# Patient Record
Sex: Male | Born: 1955 | Race: Black or African American | Hispanic: No | Marital: Married | Smoking: Current every day smoker
Health system: Southern US, Community
[De-identification: ages and names within clinical notes are randomized; demographics above are authoritative.]

## PROBLEM LIST (undated history)

## (undated) DIAGNOSIS — I10 Essential (primary) hypertension: Secondary | ICD-10-CM

## (undated) DIAGNOSIS — K219 Gastro-esophageal reflux disease without esophagitis: Secondary | ICD-10-CM

## (undated) DIAGNOSIS — R0683 Snoring: Secondary | ICD-10-CM

## (undated) DIAGNOSIS — H539 Unspecified visual disturbance: Secondary | ICD-10-CM

## (undated) DIAGNOSIS — E785 Hyperlipidemia, unspecified: Secondary | ICD-10-CM

## (undated) DIAGNOSIS — M4800 Spinal stenosis, site unspecified: Secondary | ICD-10-CM

## (undated) DIAGNOSIS — Q233 Congenital mitral insufficiency: Secondary | ICD-10-CM

## (undated) DIAGNOSIS — M1990 Unspecified osteoarthritis, unspecified site: Secondary | ICD-10-CM

## (undated) DIAGNOSIS — R32 Unspecified urinary incontinence: Secondary | ICD-10-CM

## (undated) DIAGNOSIS — M545 Low back pain, unspecified: Secondary | ICD-10-CM

## (undated) DIAGNOSIS — H9319 Tinnitus, unspecified ear: Secondary | ICD-10-CM

## (undated) DIAGNOSIS — G5600 Carpal tunnel syndrome, unspecified upper limb: Secondary | ICD-10-CM

## (undated) HISTORY — DX: Gastro-esophageal reflux disease without esophagitis: K21.9

## (undated) HISTORY — DX: Snoring: R06.83

## (undated) HISTORY — DX: Hyperlipidemia, unspecified: E78.5

## (undated) HISTORY — DX: Spinal stenosis, site unspecified: M48.00

## (undated) HISTORY — PX: CARPAL TUNNEL RELEASE: SHX101

## (undated) HISTORY — DX: Essential (primary) hypertension: I10

## (undated) HISTORY — DX: Low back pain, unspecified: M54.50

## (undated) HISTORY — PX: SPINE SURGERY: SHX786

## (undated) HISTORY — PX: PARTIAL HIP ARTHROPLASTY: SHX733

## (undated) HISTORY — PX: BACK SURGERY: SHX140

---

## 1997-01-01 ENCOUNTER — Inpatient Hospital Stay
Admission: EM | Admit: 1997-01-01 | Disposition: A | Discharge status: Home / Self Care | Payer: Self-pay | Source: Ambulatory Visit | Admitting: Internal Medicine

## 1999-01-20 ENCOUNTER — Inpatient Hospital Stay
Admission: EM | Admit: 1999-01-20 | Disposition: A | Discharge status: Home / Self Care | Payer: Self-pay | Source: Emergency Department | Admitting: Internal Medicine

## 1999-02-03 ENCOUNTER — Ambulatory Visit
Admit: 1999-02-03 | Disposition: A | Discharge status: Home / Self Care | Payer: Self-pay | Source: Ambulatory Visit | Admitting: Internal Medicine

## 2001-02-17 ENCOUNTER — Ambulatory Visit: Admission: RE | Admit: 2001-02-17 | Payer: Self-pay | Source: Ambulatory Visit | Admitting: Orthopaedic Surgery

## 2002-03-04 ENCOUNTER — Ambulatory Visit
Admit: 2002-03-04 | Disposition: A | Discharge status: Home / Self Care | Payer: Self-pay | Source: Ambulatory Visit | Admitting: Internal Medicine

## 2002-04-03 ENCOUNTER — Ambulatory Visit: Admission: RE | Admit: 2002-04-03 | Payer: Self-pay | Source: Ambulatory Visit | Admitting: Gastroenterology

## 2002-10-23 ENCOUNTER — Ambulatory Visit
Admit: 2002-10-23 | Disposition: A | Discharge status: Home / Self Care | Payer: Self-pay | Source: Ambulatory Visit | Admitting: Internal Medicine

## 2002-11-26 ENCOUNTER — Ambulatory Visit
Admit: 2002-11-26 | Disposition: A | Discharge status: Home / Self Care | Payer: Self-pay | Source: Ambulatory Visit | Admitting: Orthopaedic Surgery

## 2004-01-16 DIAGNOSIS — M797 Fibromyalgia: Secondary | ICD-10-CM

## 2004-01-16 HISTORY — DX: Fibromyalgia: M79.7

## 2004-04-11 ENCOUNTER — Emergency Department: Admit: 2004-04-11 | Payer: Self-pay | Source: Emergency Department | Admitting: Emergency Medicine

## 2004-08-02 ENCOUNTER — Observation Stay
Admission: RE | Admit: 2004-08-02 | Disposition: A | Discharge status: Home / Self Care | Payer: Self-pay | Source: Ambulatory Visit | Admitting: Orthopaedic Surgery

## 2005-01-15 DIAGNOSIS — N200 Calculus of kidney: Secondary | ICD-10-CM

## 2005-01-15 HISTORY — DX: Calculus of kidney: N20.0

## 2005-01-17 ENCOUNTER — Ambulatory Visit
Admit: 2005-01-17 | Disposition: A | Discharge status: Home / Self Care | Payer: Self-pay | Source: Ambulatory Visit | Admitting: Orthopaedic Surgery

## 2006-06-18 ENCOUNTER — Ambulatory Visit: Admission: RE | Admit: 2006-06-18 | Payer: Self-pay | Source: Ambulatory Visit | Admitting: Orthopaedic Surgery

## 2007-01-01 ENCOUNTER — Inpatient Hospital Stay
Admission: EM | Admit: 2007-01-01 | Disposition: A | Discharge status: Home / Self Care | Payer: Self-pay | Source: Emergency Department | Admitting: Internal Medicine

## 2007-01-01 LAB — CBC AND DIFFERENTIAL
Basophils Absolute: 0 /mm3 (ref 0.0–0.2)
Basophils: 0 % (ref 0–2)
Eosinophils Absolute: 0.2 /mm3 (ref 0.0–0.7)
Eosinophils: 3 % (ref 0–5)
Granulocytes Absolute: 5.2 /mm3 (ref 1.8–8.1)
Hematocrit: 43.3 % (ref 42.0–52.0)
Hgb: 14 G/DL (ref 13.0–17.0)
Immature Granulocytes Absolute: 0
Immature Granulocytes: 0 %
Lymphocytes Absolute: 1.4 /mm3 (ref 0.5–4.4)
Lymphocytes: 19 % (ref 15–41)
MCH: 29.2 PG (ref 28.0–32.0)
MCHC: 32.3 G/DL (ref 32.0–36.0)
MCV: 90.2 FL (ref 80.0–100.0)
MPV: 10.3 FL (ref 7.4–10.4)
Monocytes Absolute: 0.5 /mm3 (ref 0.0–1.2)
Monocytes: 7 % (ref 0–11)
Neutrophils %: 71 % (ref 52–75)
Platelets: 280 /mm3 (ref 140–400)
RBC: 4.8 /mm3 (ref 4.70–6.00)
RDW: 13.5 % (ref 11.5–15.0)
WBC: 7.28 /mm3 (ref 3.50–10.80)

## 2007-01-01 LAB — BASIC METABOLIC PANEL
BUN: 14 mg/dL (ref 8–20)
CO2: 24 mEq/L (ref 21–30)
Calcium: 9.5 mg/dL (ref 8.6–10.2)
Chloride: 101 mEq/L (ref 98–107)
Creatinine: 1 mg/dL (ref 0.6–1.5)
Glucose: 88 mg/dL (ref 70–100)
Potassium: 4.4 mEq/L (ref 3.6–5.0)
Sodium: 140 mEq/L (ref 136–146)

## 2007-01-01 LAB — CK
Creatine Kinase (CK): 138 U/L (ref 20–297)
Creatine Kinase (CK): 119 U/L (ref 20–297)

## 2007-01-01 LAB — I-STAT TROPONIN CERNER: i-STAT Troponin: 0 ng/mL

## 2007-01-01 LAB — TROPONIN I QUANTITATIVE LEVEL CERNER: Troponin I: 0 ng/mL

## 2007-01-01 LAB — GFR

## 2007-01-02 LAB — CK: Creatine Kinase (CK): 116 U/L (ref 20–297)

## 2007-01-02 LAB — TROPONIN I QUANTITATIVE LEVEL CERNER: Troponin I: 0.01 ng/mL

## 2007-08-27 ENCOUNTER — Ambulatory Visit: Admission: RE | Admit: 2007-08-27 | Payer: Self-pay | Source: Ambulatory Visit | Admitting: Gastroenterology

## 2007-09-13 ENCOUNTER — Ambulatory Visit
Admit: 2007-09-13 | Disposition: A | Discharge status: Home / Self Care | Payer: Self-pay | Source: Ambulatory Visit | Admitting: Orthopaedic Surgery

## 2008-01-16 HISTORY — PX: JOINT REPLACEMENT: SHX530

## 2008-03-04 ENCOUNTER — Ambulatory Visit
Admit: 2008-03-04 | Disposition: A | Discharge status: Home / Self Care | Payer: Self-pay | Source: Ambulatory Visit | Admitting: Orthopaedic Surgery

## 2008-06-04 ENCOUNTER — Inpatient Hospital Stay
Admission: RE | Admit: 2008-06-04 | Disposition: A | Discharge status: Home Health | Payer: Self-pay | Source: Ambulatory Visit | Admitting: Orthopaedic Surgery

## 2008-06-04 LAB — TYPE AND SCREEN: AB Screen Gel: NEGATIVE

## 2008-06-05 LAB — BASIC METABOLIC PANEL
BUN: 11 mg/dL (ref 8–20)
CO2: 28 mEq/L (ref 21–30)
Calcium: 8.6 mg/dL (ref 8.6–10.2)
Chloride: 99 mEq/L (ref 98–107)
Creatinine: 0.9 mg/dL (ref 0.6–1.5)
Glucose: 109 mg/dL — ABNORMAL HIGH (ref 70–100)
Potassium: 4.6 mEq/L (ref 3.6–5.0)
Sodium: 135 mEq/L — ABNORMAL LOW (ref 136–146)

## 2008-06-05 LAB — CBC
Hematocrit: 30.4 % — ABNORMAL LOW (ref 42.0–52.0)
Hgb: 9.8 G/DL — ABNORMAL LOW (ref 13.0–17.0)
MCH: 28.4 PG (ref 28.0–32.0)
MCHC: 32.2 G/DL (ref 32.0–36.0)
MCV: 88.1 FL (ref 80.0–100.0)
MPV: 9.7 FL (ref 9.4–12.3)
Platelets: 302 /mm3 (ref 140–400)
RBC: 3.45 /mm3 — ABNORMAL LOW (ref 4.70–6.00)
RDW: 13.4 % (ref 11.5–15.0)
WBC: 8.11 /mm3 (ref 3.50–10.80)

## 2008-06-05 LAB — GFR

## 2008-06-05 LAB — PT AND APTT
PT INR: 1.1 {INR} (ref 0.9–1.1)
PT: 14.6 s (ref 12.6–15.0)

## 2008-06-05 LAB — APTT: PTT: 28 s (ref 23–37)

## 2008-06-06 LAB — CBC
Hematocrit: 28.2 % — ABNORMAL LOW (ref 42.0–52.0)
Hgb: 8.8 G/DL — ABNORMAL LOW (ref 13.0–17.0)
MCH: 28.1 PG (ref 28.0–32.0)
MCHC: 31.2 G/DL — ABNORMAL LOW (ref 32.0–36.0)
MCV: 90.1 FL (ref 80.0–100.0)
MPV: 9.3 FL — ABNORMAL LOW (ref 9.4–12.3)
Platelets: 278 /mm3 (ref 140–400)
RBC: 3.13 /mm3 — ABNORMAL LOW (ref 4.70–6.00)
RDW: 13.5 % (ref 11.5–15.0)
WBC: 10.17 /mm3 (ref 3.50–10.80)

## 2008-06-06 LAB — PT AND APTT
PT INR: 1.1 {INR} (ref 0.9–1.1)
PT: 15.1 s — ABNORMAL HIGH (ref 12.6–15.0)

## 2008-06-06 LAB — BASIC METABOLIC PANEL
BUN: 10 mg/dL (ref 8–20)
CO2: 26 mEq/L (ref 21–30)
Calcium: 8.5 mg/dL — ABNORMAL LOW (ref 8.6–10.2)
Chloride: 100 mEq/L (ref 98–107)
Creatinine: 0.9 mg/dL (ref 0.6–1.5)
Glucose: 112 mg/dL — ABNORMAL HIGH (ref 70–100)
Potassium: 4 mEq/L (ref 3.6–5.0)
Sodium: 135 mEq/L — ABNORMAL LOW (ref 136–146)

## 2008-06-06 LAB — GFR

## 2008-06-06 LAB — APTT: PTT: 28 s (ref 23–37)

## 2008-06-07 LAB — CBC
Hematocrit: 28.1 % — ABNORMAL LOW (ref 42.0–52.0)
Hgb: 8.8 G/DL — ABNORMAL LOW (ref 13.0–17.0)
MCH: 28.2 PG (ref 28.0–32.0)
MCHC: 31.3 G/DL — ABNORMAL LOW (ref 32.0–36.0)
MCV: 90.1 FL (ref 80.0–100.0)
MPV: 9.2 FL — ABNORMAL LOW (ref 9.4–12.3)
Platelets: 289 /mm3 (ref 140–400)
RBC: 3.12 /mm3 — ABNORMAL LOW (ref 4.70–6.00)
RDW: 13.3 % (ref 11.5–15.0)
WBC: 10.87 /mm3 — ABNORMAL HIGH (ref 3.50–10.80)

## 2008-06-07 LAB — BASIC METABOLIC PANEL
BUN: 10 mg/dL (ref 8–20)
CO2: 29 mEq/L (ref 21–30)
Calcium: 8.7 mg/dL (ref 8.6–10.2)
Chloride: 101 mEq/L (ref 98–107)
Creatinine: 0.9 mg/dL (ref 0.6–1.5)
Glucose: 108 mg/dL — ABNORMAL HIGH (ref 70–100)
Potassium: 4.6 mEq/L (ref 3.6–5.0)
Sodium: 138 mEq/L (ref 136–146)

## 2008-06-07 LAB — APTT: PTT: 37 s (ref 23–37)

## 2008-06-07 LAB — PT AND APTT
PT INR: 1.2 {INR} — ABNORMAL HIGH (ref 0.9–1.1)
PT: 15.5 s — ABNORMAL HIGH (ref 12.6–15.0)

## 2008-06-09 ENCOUNTER — Emergency Department: Admit: 2008-06-09 | Payer: Self-pay | Source: Emergency Department | Admitting: Emergency Medicine

## 2008-09-04 ENCOUNTER — Ambulatory Visit
Admit: 2008-09-04 | Disposition: A | Discharge status: Home / Self Care | Payer: Self-pay | Source: Ambulatory Visit | Admitting: Orthopaedic Surgery

## 2008-11-01 ENCOUNTER — Ambulatory Visit
Admit: 2008-11-01 | Disposition: A | Discharge status: Home / Self Care | Payer: Self-pay | Source: Ambulatory Visit | Admitting: Orthopaedic Surgery

## 2009-10-28 ENCOUNTER — Ambulatory Visit
Admit: 2009-10-28 | Disposition: A | Discharge status: Home / Self Care | Payer: Self-pay | Source: Ambulatory Visit | Admitting: Orthopaedic Surgery

## 2009-11-02 ENCOUNTER — Ambulatory Visit
Admission: RE | Admit: 2009-11-02 | Disposition: A | Discharge status: Home / Self Care | Payer: Self-pay | Source: Ambulatory Visit | Admitting: Orthopaedic Surgery

## 2010-09-16 HISTORY — PX: COLONOSCOPY: SHX174

## 2010-10-16 ENCOUNTER — Ambulatory Visit: Admission: RE | Admit: 2010-10-16 | Payer: Self-pay | Source: Ambulatory Visit | Admitting: Orthopaedic Surgery

## 2010-10-26 LAB — ECG 12-LEAD
Atrial Rate: 74 {beats}/min
P Axis: 15 degrees
P-R Interval: 164 ms
Q-T Interval: 382 ms
QRS Duration: 92 ms
QTC Calculation (Bezet): 424 ms
R Axis: -5 degrees
T Axis: 18 degrees
Ventricular Rate: 74 {beats}/min

## 2010-11-02 NOTE — Consults (Signed)
DATE OF BIRTH:                        1955/07/22      ADMISSION DATE:                     01/01/2007            PATIENT LOCATION:                    [id number]            DATE OF CONSULTATION:      CONSULTANT:                        Jairo Ben, MD      CONSULTING SERVICE:            REFERRING PHYSICIAN:  Renette Butters, M.D.            REASON FOR CONSULTATION:  Chest pain.            HISTORY OF PRESENT ILLNESS:  This patient is a 55 year old very pleasant      gentleman, who after an emotional stress developed chest pain.  This was      substernal and epigastric area, lasted for several hours.  He also was      nauseous during the day that this started even before his chest pain.  This      was not associated with shortness of breath, syncope or presyncope.  He has      had similar episodes in the past, evaluated with stress test in 2001.            PAST MEDICAL HISTORY:  Positive for hypertension and dyslipidemia.            PAST SURGICAL HISTORY:  Lumbar laminectomy.  He does have history of      anxiety.            FAMILY HISTORY:  Pacemaker in uncle.            SOCIAL HISTORY:  He is married, has three children.  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            REVIEW OF SYSTEMS:  Negative for GI and GU symptoms.  No diarrhea.  No      fever or chills, cough.  All other systems are negative.            MEDICATIONS:  Benicar, tramadol, Voltaren.            ALLERGIES:  Cipro.            PHYSICAL EXAMINATION:  On physical examination his blood pressure is      119/67, pulse is 81 beats per minute and regular.  Respirations are 20,      temperature is 98.9.  Head and neck examination is within normal limits.      Cardiovascular examination;  No JVD, no carotid bruit.  S1, S2 regular.  No      extra sound or murmur.  Lungs; Clear to auscultation and percussion.      Abdomen; Soft and nontender.  No organomegaly.  Bowel sounds are present.      There is no peripheral edema.   Distal pulses are 2+ and equal. He is alert,      oriented x3, in appropriate mood.  There  are no focal neurologic findings.            LABORATORY DATA:  Sodium 140, potassium 4.4, BUN 14, creatinine 1.      Troponin is negative x3.  White blood cells 7.28, hemoglobin 14, platelets      280,000.  EKG is negative.  Chest x-ray was negative.            IMPRESSION      1.   Chest pain, atypical;  This patient has multiple risk factors for      coronary artery disease including his age, hypertension, and hyperlipemia      as well as his gender.  Therefore, the chest pain that occurred at the time      that he has an emotional stress may represent cardiac ischemia.  Further      workup would be needed.  Currently his _____ risk score is low with      negative troponin, negative EKG and therefore it would be appropriate to      discharge the patient for outpatient workup.  We will schedule him for      nuclear stress test tomorrow at the office.  In the meantime suggest      continuation of current medical treatment.      2.   Hypertension;  On treatment with ARB, good blood pressure control.      3.   Dyslipidemia;  On statin.  He tells me that the last measurement      several months ago showed a good cholesterol level.            Thank you very much for involving me in the care of this patient.  Please      do not hesitate to contact me if there is any question.            Sincerely,                                                Electronic Signing MD: Jairo Ben, MD  ([id number])            D: 01/02/2007 by Jairo Ben, MD      T: 01/02/2007 by [id number] ([id number]) (N: [id number])      cc:  Renette Butters, MD          Jairo Ben, MD

## 2010-11-02 NOTE — Op Note (Unsigned)
DATE OF BIRTH:                        09/24/1955      ADMISSION DATE:                     06/18/2006            PATIENT LOCATION:                     [id number]            DATE OF PROCEDURE:                   06/18/2006      SURGEON:                            Mertie Moores, MD      ASSISTANT(S):                  PREOPERATIVE DIAGNOSIS:  CARPAL TUNNEL SYNDROME LEFT UPPER EXTREMITY.            POSTOPERATIVE DIAGNOSIS:  CARPAL TUNNEL SYNDROME LEFT UPPER EXTREMITY.            PROCEDURE:  LEFT CARPAL TUNNEL DECOMPRESSION.            ANESTHESIA:  Local block with sedation.            TOURNIQUET TIME:  Approximately 20 minutes.            INDICATIONS FOR PROCEDURE:  The patient is a 55 year old status post a      right carpal tunnel decompression.  He had great relief of his symptoms on      the right side.  Now he has symptoms on the left side.  He has had ongoing      symptoms despite conservative management.  He is now for left carpal tunnel      decompression.            DESCRIPTION OF PROCEDURE:  The patient was brought to the operating room      where IV sedation was administered without difficulty.  Following this the      left upper extremity was prepped and draped in the usual sterile fashion.      The extremity was exsanguinated using an Esmarch bandage and this was left      in place proximally as a tourniquet.            Under 4.0 loupe magnification, incision was made in line with the      hypothenar crease.  This was brought down to the subcutaneous tissue using      blunt dissection.  The palmar fascia was identified and it was incised in      line with the incision.  The transverse carpal ligament was identified      beneath this and a curved clamp was placed beneath this going from distally      to proximally.  The ligament was then divided sharply down to the clamp.      The proximal portion of the ligament was divided under direct vision using      straight tenotomy scissors.  There was good retraction of  the ends of the      ligament following its division.  Inspection at this time showed some      hyperemia at  the median nerve.  There was no frank hourglass narrowing      noted.  There were no masses palpable within the canal.  There was no      hourglass narrowing noted.  There was not significant flexor      tenosynovitis.            The wound was irrigated with normal saline solution.  The palmar fascia was      reapproximated using 3-0 Vicryl sutures.  The skin was closed using 5-0      nylon sutures in an interrupted simple fashion.  Xeroform gauze was applied      followed by 4 x 4 gauze pads.  A bulky dressing was applied and held in      place with Kling followed by a Webril.  An Ace bandage was applied over      this as a compression dressing.            The Esmarch bandage was removed at this time.  There was good return of      capillary refill to the fingertips      following removal of the Esmarch bandage.  The patient was awakened without      difficulty.  He was transferred to a recovery room stretcher.  He was taken      to the secondary recovery room in stable condition having tolerated the      procedure well.                                          ___________________________________          Date Signed: __________      Mertie Moores, MD  ([id number])            D: 06/18/2006 by Mertie Moores, MD      T: 06/18/2006 by [id number] ([id number]) (F:[id number])      cc:  Mertie Moores, MD

## 2010-11-03 NOTE — Op Note (Signed)
Jesus Bowman, Jesus Bowman      MRN:          [id number]      Account:      [id number]      Document ID:  [id number]      Procedure Date: 11/02/2009            Admit Date: 11/02/2009            Patient Location: [id number]      Patient Type: A            SURGEON: Clelia Schaumann MD      ASSISTANT:                  ASSISTANT:      Tomma Rakers            PREOPERATIVE DIAGNOSES:      1.  Lumbar spinal stenosis, with right-sided radiculopathy, L4-L5.      2.  Degenerative spondylolisthesis, L4-L5.            POSTOPERATIVE DIAGNOSES:      1.  Lumbar spinal stenosis, with right-sided radiculopathy, L4-L5.      2.  Degenerative spondylolisthesis, L4-L5.            TITLES OF PROCEDURES:      Laminotomy, foraminotomy L4-L5, right.            ANESTHESIA:      General.            COMPLICATIONS:      None.            DRAINS:      None.            INDICATIONS FOR PROCEDURE:      The patient is a 55 year old gentleman with severe right leg pain secondary      to the above diagnoses, who presents now for surgery, as nonoperative      measures have failed to alleviate his symptoms.  Preoperatively, he was      counseled regarding indications for procedure and potential complications.      Additionally, the patient has an early, very mild degenerative slip.      Because he is overwhelmingly in leg pain dominantly, we have decided to      proceed with decompressive surgery.  He obtained a second opinion from a      neurosurgeon, who recommended that as well.  He is aware, however, that he      may require, and indeed is likely to require, reconstructive surgery, i.e.,      a fusion in the future at this level.            DESCRIPTION OF PROCEDURE:                                   Page 1 of 2      Jesus Bowman, Jesus Bowman      MRN:          [id number]      Account:      [id number]      Document ID:  [id number]      Procedure Date: 11/02/2009            The patient received prophylactic antibiotics in the holding area.  He was      then brought  to the operating room.  After the induction of anesthesia,  a      Foley catheter was placed, and sequential compression devices were applied      to both legs.  He was then carefully placed in the prone position on the      Wilson frame.  Care was taken to ensure his abdomen was free of compression      and all bony prominences were well padded.  The back was then prepped and      draped in usual fashion.  The patient's previous incision was utilized,      dissection carried sharply through the skin and subcutaneous tissue.      Hemostasis was obtained with use of Bovie.  The paraspinal musculature was      carefully and meticulously mobilized out to the lateral border of the facet      joint on the right.  Excellent hemostasis was obtained.  The L4-L5      interspace was well exposed.  A Taylor retractor was placed at the facet      joint.  A marker was place confirming we were indeed at L4-L5.  Once this      was done, the inferior half of the L5 lamina was thinned with a Midas Rex.      Laminotomy was completed with a Kerrison rongeur.  The ligamentum flavum      was resected, and laminotomy was also performed at L5.  The bony      decompression continued out to the medial border of the pedicle of L5, and      lateral to this at the level of the disk space, the L4 foramen was noted to      be closed by hypertrophied facet, and a foraminotomy was performed.  At the      completion of this decompression, a Woodson elevator could pass freely out      the L4 foramen, freely under and over the L5 nerve root and along the      pedicle, with no evidence of impingement.  The Kerrison was also turned,      and some central decompression was performed as well.  The wound was      irrigated.  The exposed dura was covered with Gelfoam soaked in thrombin      and Avitene slurry.  The wound was then closed at the level of the fascia      with #1 Vicryl in a figure-of-eight stitch.  Subcutaneous tissue was      repaired with  2-0 Vicryl.  The skin was closed in a subcuticular fashion.      A sterile dressing was applied.  The patient tolerated the procedure well.      There were no complications.  He was transferred to his bed and then to the      recovery room without incident.                                    D:  11/02/2009 10:45 AM by Dr. Edman Circle. Marjo Bicker, MD ([id number])      T:  11/02/2009 16:39 PM by [id number]                  VW:[id number] Al-Dalli MD  Page 2 of 2      Authenticated by Clelia Schaumann ([id number]) On 11/08/2009 02:47:26 PM

## 2010-11-03 NOTE — Discharge Summary (Signed)
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  He was admitted postoperatively      after a total hip replacement without incident.  He was managed with      appropriate antibiotic coverage and anticoagulation throughout his      hospitalization.  Ultimately, discharged on aspirin as needed for pain,      Percocet for pain, with home OT, PT arranged in stable condition, followed      as per protocol.  No complications were encountered during the procedure.      Details are found in the summary.            HOSPITAL COURSE:      He was admitted with a history of avascular necrosis of the left hip with      severe degenerative changes, history of essential hypertension, history of      anxiety, history of urinary calculi, and history of smoking.            He was admitted postoperatively for a total hip arthroplasty as outlined      after cleared medically by his primary care.  These records are on the      chart.            The patient was on Percocet, Ambien, Baclofen, fish oil, and Crestor.            Patient has an allergy to CIPRO and MORPHINE.            The patient was managed with vancomycin throughout the hospitalization      without difficulty, mobilized as per protocol, discharged on the      medications noted before on Jun 07, 2008, without incident.  The patient      will be followed in the usual post-discharge fashion.  Home OT, PT      arranged.  He was managed with an aspirin for anticoagulation and      mobilization, understands his care and his risks.  No complications were      encountered.  The patient will follow in the usual fashion.            The remainder of historical, physical, and laboratory findings can be  found      in the chart.                        Electronic Signing Provider      _______________________________     Date/Time Signed: _____________      Blanchard Kelch III MD [phone])            D:  06/30/2008 09:17 AM by Dr. Blanchard Kelch III, MD [phone])      T:  06/30/2008 11:55 AM by [id number]          Everlean Cherry: [id number]) (Doc ID: [id number])  [id number] Zenda Alpers MD

## 2010-11-03 NOTE — Op Note (Signed)
Account Number: [id number]      Document ID: [id number]      Admit Date: 06/04/2008      Procedure Date: 06/04/2008            Patient Location: [id number]      Patient Type: I            SURGEON: Blanchard Kelch III MD      ASSISTANT:  Jens Som PA                  PREOPERATIVE DIAGNOSES:      Avascular necrosis, degenerative arthritis, protrusio, left hip.            POSTOPERATIVE DIAGNOSES:      Avascular necrosis, degenerative arthritis, protrusio, left hip.            TITLE OF PROCEDURE:      Left total hip arthroplasty using Biomet porous-coated hip.            NOTE:      Prior to procedure, risks, benefits, alternatives had been discussed.  The      patient elected to proceed.  The patient understood the possibility of      infection, dislocation, clots, anesthetic issues.  All of this was      discussed in its entirety in the office and the discussion included the      above, but was not limited to the above.  The patient elected to proceed.      He had been treated conservatively for quite some time.  The patient failed      conservative treatment and elected to proceed with total hip arthroplasty.            Mr. Casimiro Needle Early assisted through all aspects of the procedure.  His help      was absolutely mandatory and greatly appreciated through all phases of the      procedure outlined below.  He is a very large gentleman.  Left hip was done      through minimally invasive approach.  Without his help, it would have been      physically impossible.            DESCRIPTION OF PROCEDURE:      The patient was placed in the right lateral decubitus position.  He was      prepped and draped using ChloraPrep.  The left hip was approached through a      minimally invasive approach through about a 3-inch incision.  He is a very      heavy individual and the incision was minimally extended, but was able to      be utilized.  Soft tissues were divided.  Fascia was divided and the      gluteus medius was noted to have  tremendous fat infiltrate.  This was      obviously muscle that had not been utilized extensively.  This was elevated      anteriorly using stay sutures of #1 Ethibond suture.  Soft tissues were      elevated.  Anterior capsulectomy was performed.  Attempts were made to      dislocate the hip.  The hip was adherent to the cup.  Once the patient was      exposed, the hip was osteotomized and subsequently the soft tissues      elevated.  Tremendous soft tissues were identified in the acetabulum.  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  Once the head was removed, wounds were      irrigated, bleeding controlled.  The acetabulum was reamed and broached      after removal of multiple loose bodies anteriorly and in the cup itself.      The patient had cystic changes in the cup identified and some evidence of      protrusio, but once this was debrided nicely, it accommodated a 52-mm      porous-coated cup.  A screw was placed using a 25-mm screw using a superior      posterior pilot hole.  Once this was completed and secured, a high-wall      liner was placed that would accommodate a 32-mm head.            Reaming and broaching accommodated a 12-mm porous-coated stem with a +6      neck.  This was nicely secured.  The high wall was in the 11 o'clock      position.  The cup was placed in as secure a position as possible to      accommodate the acetabular dysplasia.            Wounds were irrigated.  Reduction was noted to be stable.  Bleeding      controlled and wounds closed in layers using #1 vertical mattress Ethibond      sutures to reapproximate the gluteus medius and tighten this up somewhat to      help hold the hip in an internal position.  The fascia closed with      interrupted and running #1 Ethibond suture, deep fat #1 Vicryl interrupted      and running suture, and 3-0 nylon for skin.  Abduction pillow applied.  The      patient returned to recovery room in stable condition without obvious      operative  complication to be admitted as per protocol.                        Electronic Signing Provider      _______________________________     Date/Time Signed: _____________      Blanchard Kelch III MD [phone])            D:  06/04/2008 15:28 PM by Dr. Blanchard Kelch III, MD [phone])      T:  06/05/2008 11:21 AM by [id number]          Everlean Cherry: [id number]) (Doc ID: [id number])                  [id number] Zenda Alpers MD

## 2010-11-29 ENCOUNTER — Ambulatory Visit: Payer: Medicare Other | Attending: Orthopaedic Surgery

## 2010-11-29 DIAGNOSIS — M48062 Spinal stenosis, lumbar region with neurogenic claudication: Secondary | ICD-10-CM | POA: Insufficient documentation

## 2010-11-29 LAB — CBC AND DIFFERENTIAL
Basophils Absolute Automated: 0.02 10*3/uL (ref 0.00–0.20)
Basophils Automated: 0 % (ref 0–2)
Eosinophils Absolute Automated: 0.06 10*3/uL (ref 0.00–0.70)
Eosinophils Automated: 1 % (ref 0–5)
Hematocrit: 43.8 % (ref 42.0–52.0)
Hgb: 14.1 g/dL (ref 13.0–17.0)
Immature Granulocytes Absolute: 0.01 10*3/uL
Immature Granulocytes: 0 % (ref 0–1)
Lymphocytes Absolute Automated: 2.03 10*3/uL (ref 0.50–4.40)
Lymphocytes Automated: 44 % — ABNORMAL HIGH (ref 15–41)
MCH: 29.4 pg (ref 28.0–32.0)
MCHC: 32.2 g/dL (ref 32.0–36.0)
MCV: 91.4 fL (ref 80.0–100.0)
MPV: 10.6 fL (ref 9.4–12.3)
Monocytes Absolute Automated: 0.45 10*3/uL (ref 0.00–1.20)
Monocytes: 10 % (ref 0–11)
Neutrophils Absolute: 2.07 10*3/uL (ref 1.80–8.10)
Neutrophils: 45 % — ABNORMAL LOW (ref 52–75)
Nucleated RBC: 0 /100 WBC
Platelets: 248 10*3/uL (ref 140–400)
RBC: 4.79 10*6/uL (ref 4.70–6.00)
RDW: 13 % (ref 12–15)
WBC: 4.63 10*3/uL (ref 3.50–10.80)

## 2010-11-29 LAB — URINALYSIS
Bilirubin, UA: NEGATIVE
Blood, UA: NEGATIVE
Glucose, UA: NEGATIVE
Leukocyte Esterase, UA: NEGATIVE
Nitrite, UA: NEGATIVE
Protein, UR: NEGATIVE
Specific Gravity UA POCT: 1.028 (ref 1.001–1.035)
Urine pH: 6.5 (ref 5.0–8.0)
Urobilinogen, UA: 1 EU/dL

## 2010-11-29 LAB — ECG 12-LEAD
Atrial Rate: 61 {beats}/min
P Axis: 12 degrees
P-R Interval: 148 ms
Q-T Interval: 386 ms
QRS Duration: 84 ms
QTC Calculation (Bezet): 388 ms
R Axis: -6 degrees
T Axis: 20 degrees
Ventricular Rate: 61 {beats}/min

## 2010-11-29 LAB — BASIC METABOLIC PANEL
BUN: 16 mg/dL (ref 8.0–20.0)
CO2: 28 mEq/L (ref 21–30)
Calcium: 10.4 mg/dL (ref 8.5–10.5)
Chloride: 103 mEq/L (ref 96–109)
Creatinine: 1.2 mg/dL (ref 0.5–1.5)
Glucose: 79 mg/dL (ref 70–100)
Potassium: 5.5 mEq/L — ABNORMAL HIGH (ref 3.5–5.3)
Sodium: 140 mEq/L (ref 135–146)

## 2010-11-29 LAB — PT AND APTT
PT INR: 1 (ref 0.9–1.1)
PT: 13 s (ref 12.6–15.0)
PTT: 27 s (ref 23–37)

## 2010-11-29 LAB — HEMOLYSIS INDEX: Hemolysis Index: 2 Index (ref 0–9)

## 2010-11-29 LAB — GFR: EGFR: >60

## 2010-12-01 ENCOUNTER — Ambulatory Visit: Payer: Medicare Other

## 2010-12-01 ENCOUNTER — Ambulatory Visit: Payer: Medicare Other | Attending: Orthopaedic Surgery

## 2010-12-01 DIAGNOSIS — M48062 Spinal stenosis, lumbar region with neurogenic claudication: Secondary | ICD-10-CM | POA: Insufficient documentation

## 2010-12-01 LAB — TYPE AND SCREEN
AB Screen Gel: NEGATIVE
ABO Rh: B POS

## 2010-12-01 NOTE — Pre-Procedure Instructions (Signed)
Labs/EKG/ MRSA swab/UA in system. CXR done @ [facility] 11/29/10.

## 2010-12-12 ENCOUNTER — Encounter
Admission: RE | Disposition: A | Discharge status: Home Health | Payer: Self-pay | Source: Ambulatory Visit | Attending: Orthopaedic Surgery

## 2010-12-12 ENCOUNTER — Inpatient Hospital Stay: Payer: Medicare Other | Admitting: Anesthesiology

## 2010-12-12 ENCOUNTER — Inpatient Hospital Stay: Payer: Medicare Other

## 2010-12-12 ENCOUNTER — Encounter: Payer: Self-pay | Admitting: Anesthesiology

## 2010-12-12 ENCOUNTER — Inpatient Hospital Stay: Payer: Medicare Other | Admitting: Orthopaedic Surgery

## 2010-12-12 ENCOUNTER — Inpatient Hospital Stay
Admission: RE | Admit: 2010-12-12 | Discharge: 2010-12-14 | DRG: 460 | Disposition: A | Discharge status: Home Health | Payer: Medicare Other | Source: Ambulatory Visit | Attending: Orthopaedic Surgery | Admitting: Orthopaedic Surgery

## 2010-12-12 DIAGNOSIS — G5790 Unspecified mononeuropathy of unspecified lower limb: Secondary | ICD-10-CM | POA: Diagnosis present

## 2010-12-12 DIAGNOSIS — K219 Gastro-esophageal reflux disease without esophagitis: Secondary | ICD-10-CM | POA: Diagnosis present

## 2010-12-12 DIAGNOSIS — E785 Hyperlipidemia, unspecified: Secondary | ICD-10-CM | POA: Diagnosis present

## 2010-12-12 DIAGNOSIS — IMO0002 Reserved for concepts with insufficient information to code with codable children: Secondary | ICD-10-CM | POA: Diagnosis present

## 2010-12-12 DIAGNOSIS — I10 Essential (primary) hypertension: Secondary | ICD-10-CM | POA: Diagnosis present

## 2010-12-12 DIAGNOSIS — M5137 Other intervertebral disc degeneration, lumbosacral region: Principal | ICD-10-CM | POA: Diagnosis present

## 2010-12-12 DIAGNOSIS — M48061 Spinal stenosis, lumbar region without neurogenic claudication: Secondary | ICD-10-CM | POA: Diagnosis present

## 2010-12-12 DIAGNOSIS — M51379 Other intervertebral disc degeneration, lumbosacral region without mention of lumbar back pain or lower extremity pain: Principal | ICD-10-CM | POA: Diagnosis present

## 2010-12-12 DIAGNOSIS — Z96649 Presence of unspecified artificial hip joint: Secondary | ICD-10-CM

## 2010-12-12 SURGERY — FUSION, ANTERIOR LUMBAR, LATERAL POSITION, LEVEL 1 (ALIF)
Anesthesia: Anesthesia General | Wound class: Clean

## 2010-12-12 MED ORDER — ZOLPIDEM TARTRATE 5 MG PO TABS
5.0000 mg | ORAL_TABLET | Freq: Every evening | ORAL | Status: DC | PRN
Start: 2010-12-12 — End: 2010-12-12

## 2010-12-12 MED ORDER — LIDOCAINE HCL 2 % IJ SOLN
INTRAMUSCULAR | Status: DC | PRN
Start: 2010-12-12 — End: 2010-12-12
  Administered 2010-12-12: 50 mg

## 2010-12-12 MED ORDER — GLYCOPYRROLATE 0.2 MG/ML IJ SOLN
INTRAMUSCULAR | Status: DC | PRN
Start: 2010-12-12 — End: 2010-12-12
  Administered 2010-12-12: .4 mg via INTRAVENOUS

## 2010-12-12 MED ORDER — ROSUVASTATIN CALCIUM 10 MG PO TABS
10.0000 mg | ORAL_TABLET | Freq: Every evening | ORAL | Status: DC
Start: 2010-12-12 — End: 2010-12-14
  Administered 2010-12-12 – 2010-12-13 (×2): 10 mg via ORAL
  Filled 2010-12-12 (×3): qty 1

## 2010-12-12 MED ORDER — ONDANSETRON HCL 4 MG/2ML IJ SOLN
4.0000 mg | Freq: Once | INTRAMUSCULAR | Status: AC | PRN
Start: 2010-12-12 — End: 2010-12-12

## 2010-12-12 MED ORDER — NEOSTIGMINE METHYLSULFATE 1 MG/ML IJ SOLN
INTRAMUSCULAR | Status: DC | PRN
Start: 2010-12-12 — End: 2010-12-12
  Administered 2010-12-12: 2 mg via INTRAVENOUS

## 2010-12-12 MED ORDER — ACETAMINOPHEN 325 MG PO TABS
650.0000 mg | ORAL_TABLET | Freq: Four times a day (QID) | ORAL | Status: DC | PRN
Start: 2010-12-12 — End: 2010-12-14
  Administered 2010-12-13: 650 mg via ORAL
  Filled 2010-12-12: qty 2

## 2010-12-12 MED ORDER — HYDROMORPHONE PCA 0.2 MG/ML 100 ML (OUTSOURCED)
INTRAVENOUS | Status: AC
Start: 2010-12-12 — End: 2010-12-12
  Filled 2010-12-12: qty 100

## 2010-12-12 MED ORDER — SODIUM CHLORIDE 0.9 % IV MBP
1.0000 g | Freq: Three times a day (TID) | INTRAVENOUS | Status: AC
Start: 2010-12-12 — End: 2010-12-13
  Administered 2010-12-13 (×2): 1 g via INTRAVENOUS
  Filled 2010-12-12 (×3): qty 1000

## 2010-12-12 MED ORDER — FENTANYL CITRATE 0.05 MG/ML IJ SOLN
INTRAMUSCULAR | Status: AC
Start: 2010-12-12 — End: 2010-12-12
  Administered 2010-12-12 (×2): 25 ug via INTRAVENOUS
  Filled 2010-12-12: qty 2

## 2010-12-12 MED ORDER — KCL IN DEXTROSE-NACL 40-5-0.45 MEQ/L-%-% IV SOLN
INTRAVENOUS | Status: DC
Start: 2010-12-12 — End: 2010-12-14

## 2010-12-12 MED ORDER — VITAMIN D (ERGOCALCIFEROL) 1.25 MG (50000 UT) PO CAPS
50000.0000 [IU] | ORAL_CAPSULE | Freq: Every day | ORAL | Status: DC
Start: 2010-12-13 — End: 2010-12-14
  Administered 2010-12-13 – 2010-12-14 (×2): 50000 [IU] via ORAL
  Filled 2010-12-12 (×2): qty 1

## 2010-12-12 MED ORDER — HYDROMORPHONE PCA 0.2 MG/ML 100 ML (OUTSOURCED)
INTRAVENOUS | Status: DC
Start: 2010-12-12 — End: 2010-12-14

## 2010-12-12 MED ORDER — HYDROMORPHONE BOLUS FROM BAG
0.5000 mg | INTRAVENOUS | Status: DC | PRN
Start: 2010-12-12 — End: 2010-12-12
  Filled 2010-12-12: qty 2.5

## 2010-12-12 MED ORDER — BUPIVACAINE-EPINEPHRINE (PF) 0.5% -1:200000 IJ SOLN
INTRAMUSCULAR | Status: DC | PRN
Start: 2010-12-12 — End: 2010-12-12
  Administered 2010-12-12: 10 mL via INTRAMUSCULAR

## 2010-12-12 MED ORDER — BACITRACIN 50000 UNITS IM SOLR
INTRAMUSCULAR | Status: DC | PRN
Start: 2010-12-12 — End: 2010-12-12
  Administered 2010-12-12: 50000 [IU]

## 2010-12-12 MED ORDER — THROMBIN 5000 UNITS EX SOLR
CUTANEOUS | Status: DC | PRN
Start: 2010-12-12 — End: 2010-12-12
  Administered 2010-12-12: 15000 [IU] via TOPICAL

## 2010-12-12 MED ORDER — FENTANYL CITRATE 0.05 MG/ML IJ SOLN
25.0000 ug | INTRAMUSCULAR | Status: DC | PRN
Start: 2010-12-12 — End: 2010-12-12

## 2010-12-12 MED ORDER — LACTATED RINGERS IV SOLN
INTRAVENOUS | Status: DC | PRN
Start: 2010-12-12 — End: 2010-12-12

## 2010-12-12 MED ORDER — CEFAZOLIN SODIUM 1 G IJ SOLR
INTRAMUSCULAR | Status: DC | PRN
Start: 2010-12-12 — End: 2010-12-12
  Administered 2010-12-12: 2 g via INTRAVENOUS

## 2010-12-12 MED ORDER — SODIUM CHLORIDE 0.9 % IR SOLN
Status: DC | PRN
Start: 2010-12-12 — End: 2010-12-12
  Administered 2010-12-12: 1000 mL

## 2010-12-12 MED ORDER — FENTANYL CITRATE 0.05 MG/ML IJ SOLN
INTRAMUSCULAR | Status: DC | PRN
Start: 2010-12-12 — End: 2010-12-12
  Administered 2010-12-12 (×3): 50 ug via INTRAVENOUS
  Administered 2010-12-12: 100 ug via INTRAVENOUS

## 2010-12-12 MED ORDER — HYDROMORPHONE HCL PF 1 MG/ML IJ SOLN
0.5000 mg | INTRAMUSCULAR | Status: DC | PRN
Start: 2010-12-12 — End: 2010-12-12

## 2010-12-12 MED ORDER — VALSARTAN 40 MG PO TABS
40.0000 mg | ORAL_TABLET | Freq: Every day | ORAL | Status: DC
Start: 2010-12-13 — End: 2010-12-14
  Administered 2010-12-13 – 2010-12-14 (×2): 40 mg via ORAL
  Filled 2010-12-12 (×2): qty 1

## 2010-12-12 MED ORDER — SODIUM CHLORIDE BACTERIOSTATIC 0.9 % IJ SOLN
INTRAMUSCULAR | Status: DC | PRN
Start: 2010-12-12 — End: 2010-12-12
  Administered 2010-12-12: 10 mL via INTRAMUSCULAR

## 2010-12-12 MED ORDER — NALOXONE HCL 0.4 MG/ML IJ SOLN
0.1000 mg | INTRAMUSCULAR | Status: DC | PRN
Start: 2010-12-12 — End: 2010-12-14

## 2010-12-12 MED ORDER — PROPOFOL 10 MG/ML IV EMUL
INTRAVENOUS | Status: DC | PRN
Start: 2010-12-12 — End: 2010-12-12
  Administered 2010-12-12: 200 mg via INTRAVENOUS

## 2010-12-12 MED ORDER — ONDANSETRON HCL 4 MG/2ML IJ SOLN
4.0000 mg | Freq: Once | INTRAMUSCULAR | Status: DC | PRN
Start: 2010-12-12 — End: 2010-12-12

## 2010-12-12 MED ORDER — ROCURONIUM BROMIDE 50 MG/5ML IV SOLN
INTRAVENOUS | Status: DC | PRN
Start: 2010-12-12 — End: 2010-12-12
  Administered 2010-12-12: 50 mg via INTRAVENOUS
  Administered 2010-12-12: 10 mg via INTRAVENOUS

## 2010-12-12 MED ORDER — GELATIN ABSORBABLE 100 EX MISC
CUTANEOUS | Status: DC | PRN
Start: 2010-12-12 — End: 2010-12-12
  Administered 2010-12-12: 1 via TOPICAL

## 2010-12-12 MED ORDER — LACTATED RINGERS IV SOLN
INTRAVENOUS | Status: DC
Start: 2010-12-12 — End: 2010-12-14

## 2010-12-12 MED ORDER — MICROFIBRILLAR COLL HEMOSTAT EX POWD
CUTANEOUS | Status: DC | PRN
Start: 2010-12-12 — End: 2010-12-12
  Administered 2010-12-12: 1 g via TOPICAL

## 2010-12-12 MED ORDER — ONDANSETRON HCL 4 MG/2ML IJ SOLN
INTRAMUSCULAR | Status: DC | PRN
Start: 2010-12-12 — End: 2010-12-12
  Administered 2010-12-12: 4 mg via INTRAVENOUS

## 2010-12-12 MED ORDER — BACLOFEN 10 MG PO TABS
10.0000 mg | ORAL_TABLET | Freq: Every evening | ORAL | Status: DC
Start: 2010-12-12 — End: 2010-12-14
  Administered 2010-12-12 – 2010-12-13 (×2): 10 mg via ORAL
  Filled 2010-12-12 (×3): qty 1

## 2010-12-12 MED ORDER — CYCLOBENZAPRINE HCL 10 MG PO TABS
10.0000 mg | ORAL_TABLET | Freq: Three times a day (TID) | ORAL | Status: DC | PRN
Start: 2010-12-12 — End: 2010-12-12

## 2010-12-12 MED ORDER — EPINEPHRINE HCL 1 MG/ML IJ SOLN
INTRAMUSCULAR | Status: DC | PRN
Start: 2010-12-12 — End: 2010-12-12
  Administered 2010-12-12: 1 mg

## 2010-12-12 MED ORDER — SODIUM CHLORIDE 0.9 % IJ SOLN
3.0000 mL | Freq: Three times a day (TID) | INTRAMUSCULAR | Status: DC
Start: 2010-12-12 — End: 2010-12-14
  Filled 2010-12-12: qty 10

## 2010-12-12 MED ORDER — HYDROMORPHONE HCL PF 1 MG/ML IJ SOLN
INTRAMUSCULAR | Status: DC
Start: 2010-12-12 — End: 2010-12-12
  Filled 2010-12-12: qty 1

## 2010-12-12 MED ORDER — METOCLOPRAMIDE HCL 5 MG/ML IJ SOLN
INTRAMUSCULAR | Status: DC | PRN
Start: 2010-12-12 — End: 2010-12-12
  Administered 2010-12-12: 10 mg via INTRAVENOUS

## 2010-12-12 MED ORDER — PHENYLEPHRINE 100 MCG/ML IV BOLUS (ANESTHESIA)
PREFILLED_SYRINGE | INTRAVENOUS | Status: DC | PRN
Start: 2010-12-12 — End: 2010-12-12
  Administered 2010-12-12: 100 ug via INTRAVENOUS

## 2010-12-12 SURGICAL SUPPLY — 113 items
BAG DECANTER STERILE (Procedure Accessories) ×2 IMPLANT
BANDAGE ADH PLSTR POLYACRYLATE CVRL 2YD (Bandage) ×1
BANDAGE ADHESIVE L2 YD X W6 IN STRETCH (Bandage) ×1 IMPLANT
BANDAGE ADHESIVE L2 YD X W6 IN STRETCH NONWOVEN POROUS COVER-ROLL (Bandage) ×1 IMPLANT
BNDG CVRL ADH 2YDX6IN PLSTR POLYACRYLATE (Bandage) ×1
CATH IV 14GX1.75IN NLTX (IV Supply) ×2 IMPLANT
CATHETER FOLEY KT 16FR (Catheter Micellaneous) ×1
CATHETER FOLEY KT 16FR (Catheter Miscellaneous) ×1 IMPLANT
CLEANER ELECTROSURGICAL TIP PENCIL (Procedure Accessories) ×2 IMPLANT
CLEANER ELECTROSURGICAL TIP PENCIL HANDSWITCH LECTROBRASIVE (Procedure Accessories) ×2 IMPLANT
CLEANER ESURG TIP PNCL LCTRBRS STRL (Procedure Accessories) ×4
CLOSURE STERI-STRIP 1X5IN (Dressing) ×4 IMPLANT
CONTAINER SPEC 8OZ NS SNPON LID TRNLU (Suction) ×2 IMPLANT
CORD BIPOLAR STRL DISP (Cautery) ×2 IMPLANT
COTTONOID 1/2X1" 80-1402" (Dressing) ×2 IMPLANT
COVER HNDL LIGHT SINGLES (Procedure Accessories) ×4 IMPLANT
COVER HVYDTY BLK 65X90IN (Drape) ×2 IMPLANT
COVER MAYO CNVRT STND 23IN PLS REINF (Drape) ×2
COVER STAND W23 IN REINFORCE PLASTIC (Drape) ×2 IMPLANT
COVER STND PLS MAYO CNVRT 23IN LF STRL (Drape) ×2 IMPLANT
CTTND 1/2X1/2" (Sponge) ×2 IMPLANT
DRAIN INCS SIL FULL FLUT FLT 3/8IN LF (Drain) ×2
DRAIN RADIOPAQUE 4 FREE FLOW CHANNEL (Drain) ×1 IMPLANT
DRAIN RADIOPAQUE 4 FREE FLOW CHANNEL BARD L3/8 IN INCISION SILICONE (Drain) ×1 IMPLANT
DRAPE 3/4 SHEET FANFLD 52X76IN (Drape) ×6 IMPLANT
DRAPE 74X41IN UNIVERSAL POLY XRAY C ARM CLOSURE STRAP (Drape) ×3 IMPLANT
DRAPE EQP VLCR POLY UNV STRDRP 74X41IN (Drape) ×6 IMPLANT
DRAPE INCISE IOBAN-2 60X35CM (Drape) ×2 IMPLANT
DRAPE MAG FM DVN 20X16IN LF STRL HNDFR (Drape) ×2
DRAPE MAGNETIC HAND FREE TRANSFER (Drape) ×1 IMPLANT
DRAPE SRG PE STRDRP 17X11IN LF STRL ADH (Drape) ×10 IMPLANT
DRAPE SRG TBRN CNVRT 122X106X77IN LF (Drape) ×2 IMPLANT
DRAPE SURGICAL ADHESIVE STRIP SMALL (Drape) ×5
DRAPE SURGICAL ADHESIVE STRIP SMALL TOWEL MATTE FINISH L17 IN X W11 IN (Drape) ×5 IMPLANT
DRAPE SURGICAL IMPERVIOUS REINFORCEMENT FENESTRATE ABSORBENT ARMBOARD (Drape) ×1 IMPLANT
DRESSING TRANSPARENT L4 3/4 IN X W4 IN (Dressing) ×2 IMPLANT
DRESSING TRANSPARENT L4 3/4 IN X W4 IN POLYURETHANE ADHESIVE (Dressing) ×2 IMPLANT
DRESSING TRNS PU STD TGDRM 4.75X4IN LF (Dressing) ×4
EVACUATOR RELIAVAC 100M (Drain) ×2 IMPLANT
EVACUATOR WOUND SILICONE 100CC (Drain) ×2 IMPLANT
GLOVE SRG BGL M 8 LTX STRL PF TXTR BEAD (Glove) ×4
GLOVE SRG NTR RBR 8 INDCTR BGL 299X103MM (Glove) ×4
GLOVE SURG BIOGEL LF SZ8 (Glove) ×4 IMPLANT
GLOVE SURG BIOGEL ORTHO SZ8 (Glove) ×4 IMPLANT
GLOVE SURGICAL 8 INDICATOR BIOGEL POWDER (Glove) ×2 IMPLANT
GLOVE SURGICAL 8 INDICATOR BIOGEL POWDER FREE SMOOTH BEAD CUFF (Glove) ×2 IMPLANT
GLOVE SURGICAL 8 POWDER FREE TEXTURE (Glove) ×2 IMPLANT
GLOVE SURGICAL 8 POWDER FREE TEXTURE BEAD CUFF NONPYROGENIC BIOGEL M (Glove) ×2 IMPLANT
GOWN SMART IMPERVIOUS LARGE (Gown) IMPLANT
GRAFT BN CANC DBM OSTEOSPONGE 50X20X7MM (Allograft) ×2 IMPLANT
GRAFT BN CORT CANC 20CC 1-8MM RDGRF (Allograft) ×2 IMPLANT
GRAFT BONE CANCELLOUS DEMINERALIZED BONE MATRIX L50 MM X W20 MM X H7 (Allograft) ×1 IMPLANT
GRAFT BONE CORTICAL CANCELLOUS 20 CC (Allograft) ×1 IMPLANT
GRAFT BONE CORTICAL CANCELLOUS 20 CC ALLOGRAFT CHIP PRESERVON (Allograft) ×1 IMPLANT
HRST DONUT HL-IN-ONE (Positioning Supplies) ×2 IMPLANT
INACTIVE USE LAWSON 93583 (Gown) ×2 IMPLANT
NEEDLE NOKOR FILTER 19GX1.5IN (Needles) ×2 IMPLANT
NEEDLE REG BEVEL 19GX1.5IN (Needles) ×4 IMPLANT
NEEDLE SPINAL BD OD22 GA L3 1/2 IN (Needles) ×1 IMPLANT
NEEDLE SPINAL L3 1/2 IN REGULAR WALL QUINCKE TIP OD22 GA BD (Needles) ×1 IMPLANT
NEEDLE SPNL PP RW BD QNCK 22GA 3.5IN LF (Needles) ×2
PACK AVID LUMBAR LAMINECTOMY (Pack) ×2 IMPLANT
PAD ABC GROUNDING BLUE (Laser Supplies) ×4 IMPLANT
PAD ABD PVC CRTY 9X5IN LF STRL 3 LYR (Dressing) ×2 IMPLANT
PAD ARMBOARD 20X8X2IN (Procedure Accessories) ×14 IMPLANT
PAD ELECTROSRG GRND REM W CRD (Procedure Accessories) ×4 IMPLANT
PATTIES 1X3 USE LAWSON 2442 (Sponge) ×2 IMPLANT
PENCIL ELECTRO PUSH BUTTON (Cautery) ×2 IMPLANT
POSITIONER HEAD SLOTTED ADLT (Positioning Supplies) ×2 IMPLANT
ROD SPINAL DENALI L40 MM OD5.5 MM (Rods) ×2 IMPLANT
ROD SPINAL DENALI L40 MM OD5.5 MM TITANIUM CONTOUR GRAY (Rods) ×2 IMPLANT
ROD SPNL TI CNTR DENALI 5.5MM 40MM NS (Rods) ×4 IMPLANT
SCREW 7.5X50MM (Screw) ×4 IMPLANT
SCREW BN TI MESA 6.5MM 50MM LF NS FNDTN (Screw) ×4 IMPLANT
SCREW L50 MM OD6.5 MM TITANIUM SPINE (Screw) ×2 IMPLANT
SCREW L50 MM OD6.5 MM TITANIUM SPINE FOUNDATION BONE MESA (Screw) ×2 IMPLANT
SLEEVE TED SCD LARGE (Procedure Accessories) ×2 IMPLANT
SOLUTION SRGPRP 74% ISPRP 0.7% IOD (Prep) ×4
SOLUTION SURGICAL PREP 26 ML DURAPREP (Prep) ×2 IMPLANT
SOLUTION SURGICAL PREP 26 ML DURAPREP 74% ISOPROPYL ALCOHOL 0.7% (Prep) ×2 IMPLANT
SPONGE GAUZE 12PLY STRL 4X4IN (Sponge) ×2 IMPLANT
SPONGE KITTNER ENDOSCOPIC (Sponge) ×2 IMPLANT
SPONGE LAP 18X18IN PREWASH WHT (Sponge) ×2
SPONGE LAPAROTOMY L18 IN X W18 IN (Sponge) ×1 IMPLANT
SPONGE LAPAROTOMY L18 IN X W18 IN PREWASH WHITE (Sponge) ×1 IMPLANT
SPONGE PEANUT C5 HOLDER DISSECTOR XRAY (Sponge) IMPLANT
SPONGE PEANUT C5 HOLDER DISSECTOR XRAY DETECTABLE OD3/8 IN DUKAL (Sponge) IMPLANT
SPONGE PEANUT RADPQE STRL3/8IN (Sponge)
SPONGE SRG VISTEC 8X4IN LF STRL 12 PLY (Sponge) ×2
SPONGE SURGICAL L8 IN X W4 IN 12 PLY (Sponge) ×1 IMPLANT
SPONGE SURGICAL L8 IN X W4 IN 12 PLY RADIOPAQUE BAND VISTEC BLUE WHITE (Sponge) ×1 IMPLANT
SUTURE ABS 1 CT1 VCL 18IN CR BRD 8 STRN (Suture) ×4
SUTURE ABS 2-0 CT1 VCL 18IN CR BRD 8 (Suture) ×4
SUTURE ABS 3-0 PS1 VCL MTPS 27IN BRD (Suture) ×4
SUTURE COATED VICRYL 1 CT-1 L18 IN (Suture) ×2 IMPLANT
SUTURE COATED VICRYL 2-0 CT-1 18IN CNTRL BRD 8 STRND UNDYED (Suture) ×2 IMPLANT
SUTURE COATED VICRYL 2-0 CT-1 L18 IN (Suture) ×2 IMPLANT
SUTURE COATED VICRYL 3-0 PS-1 L27 IN (Suture) ×2 IMPLANT
SYRINGE LUER LOCK 10CC (Syringes, Needles) ×8 IMPLANT
SYSTEM IRR MTL VTVU YNKR 12FR LF STRL (Suction) ×2 IMPLANT
SYSTEM IRRIGATION EXTEND SUCTION ILLUMINATION TUBE BULB TIP VITAL-VUE (Suction) ×1 IMPLANT
TAPE SURGICAL DURAPORE 3IN (Tape) ×8 IMPLANT
TOOL DISSECTING L14 CM MATCH HEAD FLUTE (Burr) ×1 IMPLANT
TOOL DISSECTING L14 CM MATCH HEAD FLUTE OD3 MM MIDAS REX LEGEND (Burr) ×1 IMPLANT
TOOL DSCT LGND 3MM 14MM (Burr) ×2
TOWEL L26 IN X W17 IN COTTON PREWASH DELINT BLUE ACTISORB DELUXE (Procedure Accessories) ×2 IMPLANT
TOWEL SRG CTTN 26X17IN LF STRL PREWASH (Procedure Accessories) ×4 IMPLANT
TOWEL STERILE REUSABLE 8PK (Procedure Accessories) ×4 IMPLANT
TRAY CATHETER FOLEY 16F (Tray) ×2 IMPLANT
TUBING CONNECTING STERILE 10FT (Tubing) ×1
TUBING SCT PVC ARG 3/16IN 10FT LF STRL (Tubing) ×1
TUBING SUCTION ID3/16 IN L10 FT (Tubing) ×1 IMPLANT
TUBING SUCTION ID3/16 IN L10 FT NONCONDUCTIVE STRAIGHT MALE FEMALE (Tubing) ×1 IMPLANT

## 2010-12-12 NOTE — OR Nursing (Signed)
Updated the family spoke to the wife.

## 2010-12-12 NOTE — Plan of Care (Signed)
Problem: Day of Surgery- Lumbar Discectomy/Laminectomy  Goal: Pain at adequate level as identified by patient  Outcome: Progressing  Assess pain every 2 hours . instruct pt on pca use

## 2010-12-12 NOTE — Progress Notes (Signed)
Pt arrived from pacu in bed to room 481, s/p laminectomy. Pt is sleepy but arousable. Pt has pca dilaudid 0.3 q 10 minute lock out, 6 doses max. ivf d51/2 ns with kcl at 100cc/hr. Pt denies any pain at present , spouse at bedside. Pt has foley to gravity and a JP to the left side from the back incision. Back dressing cdi, no drainage noted at site. Admission assessment completed, await dr childs to reconcile pca orders. md still at surgery.

## 2010-12-12 NOTE — Anesthesia Postprocedure Evaluation (Signed)
Anesthesia Post Evaluation    Patient: Jesus Bowman    Procedures performed: Procedure(s) with comments:  FUSION, ANTERIOR LUMBAR, LATERAL POSITION LEVEL 1 - L4-L5 ANTERIOR/POSTERIOR FUSION  LAMINECTOMY, POSTERIOR LUMBAR, DECOMP, INST, FUSION LEVEL 1 - L4-L5 ANTERIOR/POSTERIOR FUSION    Anesthesia type: General ETT    Patient location:Phase I PACU    Last vitals:   Blood pressure 124/77, pulse 77, temperature 98 F (36.7 C), temperature source Temporal Artery, resp. rate 16, weight 112.7 kg (248 lb 7.3 oz), SpO2 100.00%.    Post pain: Patient not complaining of pain, continue current therapy     Mental Status:sedated    Respiratory Function: tolerating face mask    Cardiovascular: stable    Nausea/Vomiting: patient not complaining of nausea or vomiting    Hydration Status: adequate    Regional Anesthesia: no apparent complications    Post assessment: no apparent anesthetic complications

## 2010-12-12 NOTE — Progress Notes (Signed)
POD 0 check.  Pt is AAx3, reports that pain is controlled with current PCA settings.  (2.4mg  dilaudid pca/4hrs).  95% on 2L NC, RR18.  Verified pump settings, encouraged patient to continue with pca use as needed.  Pt verbalizes an understanding.

## 2010-12-12 NOTE — Progress Notes (Signed)
Pt discharged to floor,  VSS, pain tolerable, Neuros intact, pt still has some numbness in RLE, same as pre-op ,   Phase 1 criteria met.  Report given to accepting RN.  Family notified.

## 2010-12-12 NOTE — Brief Op Note (Signed)
BRIEF OP NOTE    Date Time: 12/12/2010 12:54 PM    Patient Name:   Jesus Bowman    Date of Operation:   12/12/2010    Providers Performing:   Surgeon(s):  Clelia Schaumann, MD    Assistant (s): Vivien Rota    Operative Procedure:   Procedure(s):  FUSION, ANTERIOR LUMBAR, LATERAL POSITION LEVEL 1  LAMINECTOMY, POSTERIOR LUMBAR, DECOMP, INST, FUSION LEVEL 1    Preoperative Diagnosis:   Pre-Op Diagnosis Codes:     * Lumbar stenosis L4L5     * Degeneration of lumbar or lumbosacral intervertebral disc [722.52]    Postoperative Diagnosis:   same    Anesthesia:   General    Estimated Blood Loss:    75 cc    Implants:     Implant Name Type Inv. Item Serial No. Manufacturer Lot No. LRB No. Used Action   corticocanc     [id number] N/A 1 Implanted   bacterin     [id number] N/A 1 Implanted   SCREW MESA POLY 6.5X50MM - [id number] Screw SCREW MESA POLY 6.5X50MM  K2M  N/A 2 Implanted   SCREW 7.5X50MM - LOG339 Screw SCREW 7.5X50MM  K2M  N/A 2 Implanted   ROD SPINE CONTOURED 5.5 X 40 - [id number] Rods ROD SPINE CONTOURED 5.5 X 40   K2M   N/A 2 Implanted       Drains:   Drains: {DRAIN YES    Specimens:       Findings:   stenosis    Complications:   none      Signed by: Clelia Schaumann, MD                                                                              [facility]

## 2010-12-12 NOTE — Anesthesia Preprocedure Evaluation (Addendum)
Anesthesia Evaluation    AIRWAY    Mallampati: III    TM distance: >3 FB  Neck ROM: full  Mouth Opening:full   CARDIOVASCULAR    cardiovascular exam normal     DENTAL    (+) implants   PULMONARY    pulmonary exam normal and clear to auscultation     OTHER FINDINGS          Anesthesia Plan    ASA 3   general   Detailed anesthesia plan: general endotracheal        intravenous induction   informed consent obtained    Plan discussed with CRNA.        <IHSANLANDD>

## 2010-12-12 NOTE — H&P (Signed)
Jesus Bowman is an 55 y.o. male.    Past Medical History   Diagnosis Date   . Hypertension    . Hyperlipidemia    . Snoring    . Anemia      "borderline anemia"   . GERD (gastroesophageal reflux disease)    . Low back pain    . Rash      chronic groin rash    . Difficulty in walking      uses cane occasionally   . Vision abnormalities      wears glasses   . Spinal stenosis    . Neuropathy      bl le       Allergies:   Allergies   Allergen Reactions   . Ciprofloxacin Itching   . Morphine Nausea And Vomiting       Active Problems:   * No active hospital problems. *     Blood pressure 138/73, pulse 55, temperature 98 F (36.7 C), temperature source Temporal Artery, resp. rate 20, weight 112.7 kg (248 lb 7.3 oz), SpO2 98.00%.    Review of Systems   Constitutional: Negative.    HENT: Negative.    Eyes: Negative.    Respiratory: Negative.    Cardiovascular: Negative.    Gastrointestinal: Negative.    Genitourinary: Negative.    Musculoskeletal: Positive for back pain.   Neurological: Positive for sensory change and focal weakness.   Endo/Heme/Allergies: Negative.    Psychiatric/Behavioral: Negative.        Physical Exam   Constitutional: He is oriented to person, place, and time. He appears well-developed and well-nourished.   HENT:   Head: Normocephalic.   Eyes: Pupils are equal, round, and reactive to light.   Neck: Normal range of motion. Neck supple.   Cardiovascular: Normal rate and regular rhythm.    Pulmonary/Chest: Effort normal and breath sounds normal.   Abdominal: Soft. Bowel sounds are normal.   Musculoskeletal: Normal range of motion.   Neurological: He is alert and oriented to person, place, and time. He has normal reflexes.   Skin: Skin is warm and dry.   Psychiatric: He has a normal mood and affect.       Assessment:  Degenerative disc disease  Lumbar radiculopathy L4L5  Plan:  surgery    Clelia Schaumann  12/12/2010

## 2010-12-12 NOTE — Progress Notes (Signed)
Patient arrived to unit with PCA hanging and PCA orders not reconciled from PACU.  Called PACU, MD aware to reconcile.  Also called [facility] to make her aware.  Bedside nurse made aware.

## 2010-12-13 LAB — RED BLOOD CELLS OR HOLD

## 2010-12-13 LAB — HEMOGLOBIN AND HEMATOCRIT, BLOOD
Hematocrit: 37.7 % — ABNORMAL LOW (ref 42.0–52.0)
Hgb: 12.2 g/dL — ABNORMAL LOW (ref 13.0–17.0)

## 2010-12-13 MED ORDER — HYDROMORPHONE BOLUS FROM BAG
0.50 mg | Freq: Once | INTRAVENOUS | Status: AC
Start: 2010-12-13 — End: 2010-12-13
  Administered 2010-12-13: 0.5 mg via INTRAVENOUS
  Filled 2010-12-13: qty 2.5

## 2010-12-13 MED ORDER — ZOLPIDEM TARTRATE 5 MG PO TABS
10.00 mg | ORAL_TABLET | Freq: Every evening | ORAL | Status: DC | PRN
Start: 2010-12-13 — End: 2010-12-14
  Administered 2010-12-13: 10 mg via ORAL
  Filled 2010-12-13: qty 2

## 2010-12-13 NOTE — Progress Notes (Signed)
Foley d/ced at 0945 this am, urinal at bedside and fluids encouraged. Practicing IS can calf pumps. Back dressing clean dry and intact with jp drain patent to compression suction with return of sangenous drainage. Dangled at bedside and ambulated in room, up in bedside chair tolerating well.

## 2010-12-13 NOTE — Progress Notes (Signed)
Met with pt to issue IMM and to discuss [place] plans.  Expect home with family care.  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  Await therapy sessions and any further recommendations.

## 2010-12-13 NOTE — Progress Notes (Signed)
PROGRESS NOTE    Date Time: 12/13/2010 11:09 AM  Patient Name: Jesus Bowman  Attending Physician: Clelia Schaumann, MD    Assessment:   Stable    Plan:   PT/OT for ambulation and mobilization    Subjective:   C/O expected incisional pain.  Moving LEs well.  Dressing C/D/I   JP drain patent.      Medications:     Current Facility-Administered Medications   Medication Dose Route Frequency   . baclofen  10 mg Oral QHS   . cefazolin  1 g Intravenous Q8H   . HYDROmorphone  0.5 mg Intravenous Once   . rosuvastatin  10 mg Oral QHS   . sodium chloride (PF)  3 mL Intravenous Q8H   . valsartan  40 mg Oral Daily   . Vitamin D (Ergocalciferol)  50,000 Units Oral Daily       Physical Exam:     Filed Vitals:    12/13/10 0837   BP: 124/67   Pulse: 77   Temp: 99.2 F (37.3 C)   Resp: 20         Intake/Output Summary (Last 24 hours) at 12/13/10 1109  Last data filed at 12/13/10 0446   Gross per 24 hour   Intake   3510 ml   Output   2645 ml   Net    865 ml       Drains:     Closed/Suction Drain Posterior Bulb 10 Fr. (Active)   Site Description Clean;Dry;Intact 12/12/2010  3:00 PM   Dressing Status Clean;Gauze 12/12/2010  3:00 PM   Drainage Appearance Bloody 12/12/2010  3:00 PM   Status To bulb suction 12/12/2010  3:00 PM   Output (mL) 50 mL 12/13/2010  4:12 AM   Number of days:1       Urethral Catheter Double-lumen 16 Fr. (Active)   Line necessity reviewed? Yes 12/12/2010  1:35 PM   Site Assessment Clean 12/12/2010  1:35 PM   Collection Container Standard drainage bag 12/12/2010  1:35 PM   Securement Method Leg strap;Tape 12/12/2010  1:35 PM   Reason for Continuing Urinary Catheterization past POD 1 Comfort/Palliative care (pain with moving) 12/12/2010  1:35 PM   Output (mL) 400 mL 12/13/2010  4:12 AM   Number of days:1             Labs:     Lab Results   Component Value Date    WBC 4.63 11/29/2010    HGB 12.2* 12/13/2010    HCT 37.7* 12/13/2010    MCV 91.4 11/29/2010       Rads:   Radiological Procedure  reviewed.Yes          Signed by: Dewaine Oats, MD  Date/Time: 12/13/2010 11:09 AM

## 2010-12-13 NOTE — Anesthesia Postprocedure Evaluation (Signed)
Anesthesia Post Evaluation    Patient: Jesus Bowman    Procedures performed: Procedure(s) with comments:  FUSION, ANTERIOR LUMBAR, LATERAL POSITION LEVEL 1 - attempted L4-L5 ANTERIOR/POSTERIOR FUSION  LAMINECTOMY, POSTERIOR LUMBAR, DECOMP, INST, FUSION LEVEL 1 - L4-L5 ANTERIOR/POSTERIOR FUSION      See nursing record for vitals prior to [place] from pacu

## 2010-12-13 NOTE — Progress Notes (Signed)
Patient ambulated out in hall down to desk and back to bathroom, trying to void, unable, fluids encouraged.

## 2010-12-13 NOTE — Progress Notes (Addendum)
PAIN MANAGEMENT PROGRESS NOTE     Jesus Bowman is a 55 y.o. male admitted with Spinal stenosis, lumbar region, with neurogenic claudication [724.03] (SPIN [name] CLAUD)  LUMB/LUMBOSAC DISC DEGEN  161096 History of spinal [phone].    Allergies   Allergen Reactions   . Ciprofloxacin Itching   . Morphine Nausea And Vomiting       Uncomplicated recovery from anesthetic: yes    1 Day Post-Op  Procedure:Procedure(s):  FUSION, ANTERIOR LUMBAR, LATERAL POSITION LEVEL 1  LAMINECTOMY, POSTERIOR LUMBAR, DECOMP, INST, FUSION LEVEL 1 12/12/2010 - 12/13/2010      History   PMH:   Past Medical History   Diagnosis Date   . Hypertension    . Hyperlipidemia    . Snoring    . GERD (gastroesophageal reflux disease)    . Low back pain    . Rash      chronic groin rash    . Difficulty in walking      uses cane occasionally   . Vision abnormalities      wears glasses   . Spinal stenosis    . Neuropathy      bl le     PSH:   Past Surgical History   Procedure Date   . Carpal tunnel release      left 2008, right 2003   . Colonoscopy 09/2010   . Joint replacement 2010     left hip   . Back surgery 2006 and 2011     back surgery x 2   . Spine surgery      laminectomy 2006 & 2011     BMI: Body mass index is 38.84 kg/(m^2).    Home Pain Regimen:   Prescriptions prior to admission   Medication Sig   . Ascorbic Acid (VITAMIN C PO) Take 1 tablet by mouth daily.     Marland Kitchen b complex vitamins tablet Take 1 tablet by mouth every other day.     . baclofen (LIORESAL) 10 MG tablet Take 10 mg by mouth nightly.     . diclofenac (VOLTAREN) 50 MG EC tablet Take 50 mg by mouth daily. Take 2 tabs    . HYDROcodone-acetaminophen (VICODIN) 5-500 MG per tablet Take 1 tablet by mouth every 6 (six) hours as needed.     . IRON PO Take 1 tablet by mouth every other day.     Marland Kitchen MAGNESIUM PO Take 1 tablet by mouth every other day.     . Misc Natural Products (PROSTATE SUPPORT PO) Take 1 tablet by mouth daily.     . Multiple Vitamins-Minerals (ZINC PO) Take  1 tablet by mouth every other day.     . olmesartan (BENICAR) 40 MG tablet Take 40 mg by mouth daily.     . rosuvastatin (CRESTOR) 10 MG tablet Take 10 mg by mouth nightly.     Marland Kitchen VITAMIN D, ERGOCALCIFEROL, PO Take 1 tablet by mouth every other day.           Labs     Recent Labs   Basename 12/13/10 0530    WBC --    HGB 12.2*    HCT 37.7*       No results found for this basename: BUN,CREAT,AST,ALT,ALP,BILIT in the last 24 hours    No results found for this basename: INR in the last 24 hours    No results found for this basename: PT,PTT in the last 24 hours  Medication     Current Facility-Administered Medications   Medication Dose Route Frequency   . baclofen  10 mg Oral QHS   . cefazolin  1 g Intravenous Q8H   . HYDROmorphone  0.5 mg Intravenous Once   . rosuvastatin  10 mg Oral QHS   . sodium chloride (PF)  3 mL Intravenous Q8H   . valsartan  40 mg Oral Daily   . Vitamin D (Ergocalciferol)  50,000 Units Oral Daily       PRN Meds: acetaminophen, naloxone, ondansetron, zolpidem, DISCONTD: bacitracin, DISCONTD: bupivacaine-EPINEPHrine (PF), DISCONTD: cyclobenzaprine, DISCONTD: EPINEPHrine, DISCONTD: fentaNYL, DISCONTD: gelatin adsorbable, DISCONTD: HYDROmorphone, DISCONTD: HYDROmorphone, DISCONTD: microfibrillar collagen, DISCONTD: ondansetron, DISCONTD: sodium chloride, DISCONTD: Sodium Chloride Bacteriostatic, DISCONTD: thrombin, DISCONTD: zolpidem      CONTINUOUS INFUSIONS:       . dextrose 5 % and 0.45 % NaCl with KCl 40 mEq 100 mL/hr at 12/13/10 5784   . HYDROmorphone     . lactated ringers 100 mL/hr at 12/12/10 1324       Drug:Dilaudid   Continuous Rate:0  PCA Demand Dose: 0.3mg     Lockout Interval:   Max Doses Per Hour:6    Usage:6.44mg /12hr      On Q pump: no    Assessment:     Filed Vitals:    12/13/10 0837   BP: 124/67   Pulse: 77   Temp: 99.2 F (37.3 C)   Resp: 20       Alert and oriented: yes  Foley: no  Voiding: no ( has not yet voided since Foley d/cd today)  Chest tube:  no      Pain:  Pain Assessment  Pain Assessment: Numeric Scale (0-10) (12/13/10 1034)  Pain Score: 8-severe pain (12/13/10 1034)  POSS Score: Awake and Alert (12/13/10 1034)  Pain Location: Back (12/13/10 1034)  Pain Orientation: Lower (12/13/10 1034)  Pain Descriptors: Lambert Mody (12/13/10 1034)  Pain Frequency: Continuous (12/13/10 1034)  Effect of Pain on Daily Activities: moderate (12/13/10 1034)  Patient's Stated Comfort Functional Goal: 4-moderate pain (12/13/10 1034)  Pain Intervention(s): Medication (See eMAR) (12/13/10 1034)  Multiple Pain Sites: No (12/13/10 1034)                          Respiratory Assessment:    Oxygen Therapy  SpO2: 97 % (12/13/10 1034)  O2 Device: Nasal cannula (12/13/10 1034)  O2 Flow Rate (L/min): 2 L/min (12/13/10 1034)  Pulse Oximetry Type: Continuous (12/12/10 1324)    Nutrition/GI Status: small amount regular    General          Plan     Impression: Relates he wants  to get out of bed , has lower back pain, 8/10 .     POST-OPERATIVE PAIN    NEUROPATHIC PAIN    Patient or surrogate educated re: safe use of PCA and/or pain medication.     Patient or surrogate verbalizes or demonstrates understanding.      Additional Comments:  Instructed to increase PCA dosage especially prior to increased activity.     Plan:  Will give PCA Dilaudid 0.5mg  bolus x 1 now , prior to getting out of bed. Continue PCA at present settings.            Note was completed by Hillis Range 12/13/2010 11:00 AM     Anesthesiology Acute Pain Service Attending Note    Pain Service Progress Note and interval history reviewed:  agree  Patient seen and examined.    History:  mild    Physical exam:  awake    Plan:  Continue    Level of Service:  1    [name] Eben Burow

## 2010-12-14 LAB — HEMOGLOBIN AND HEMATOCRIT, BLOOD
Hematocrit: 36.6 % — ABNORMAL LOW (ref 42.0–52.0)
Hgb: 11.6 g/dL — ABNORMAL LOW (ref 13.0–17.0)

## 2010-12-14 MED ORDER — SENNOSIDES-DOCUSATE SODIUM 8.6-50 MG PO TABS
1.00 | ORAL_TABLET | Freq: Every evening | ORAL | Status: DC
Start: 2010-12-14 — End: 2010-12-14

## 2010-12-14 MED ORDER — OXYCODONE-ACETAMINOPHEN 5-325 MG PO TABS
2.00 | ORAL_TABLET | ORAL | Status: AC | PRN
Start: 2010-12-14 — End: 2010-12-24

## 2010-12-14 MED ORDER — OXYCODONE-ACETAMINOPHEN 5-325 MG PO TABS
1.00 | ORAL_TABLET | ORAL | Status: DC | PRN
Start: 2010-12-14 — End: 2010-12-14

## 2010-12-14 MED ORDER — OXYCODONE-ACETAMINOPHEN 5-325 MG PO TABS
2.00 | ORAL_TABLET | ORAL | Status: DC | PRN
Start: 2010-12-14 — End: 2010-12-14
  Administered 2010-12-14: 2 via ORAL
  Filled 2010-12-14: qty 2

## 2010-12-14 MED ORDER — HYDROMORPHONE HCL PF 1 MG/ML IJ SOLN
0.50 mg | INTRAMUSCULAR | Status: DC | PRN
Start: 2010-12-14 — End: 2010-12-14

## 2010-12-14 NOTE — Consults (Signed)
[facility]     Physical Therapy Evaluation     Patient: Jesus Bowman    MRN#: [id number]     Time of treatment: Time Calculation  PT Received On: 12/14/10  Start Time: 0930  Stop Time: 1000  Time Calculation (min): 30 min    Consult received for Jesus Bowman for PT Evaluation and Treatment.  Patient's medical condition is appropriate for Physical Therapy intervention at this time.    Precautions and Contraindications:   No bending, lifting, twisting        Medical Diagnosis: Spinal stenosis, lumbar region, with neurogenic claudication [724.03] (SPIN Waylan Rocher)  LUMB/LUMBOSAC DISC DEGEN  073710 History of spinal [phone]    History of Present Illness: Jesus Bowman is a 55 y.o. male admitted on 12/12/2010 s/p anterior/posterior lumbar fusion L4-5 12-12-10.    There is no problem list on file for this patient.       Past Medical/Surgical History:  Past Medical History   Diagnosis Date   . Hypertension    . Hyperlipidemia    . Snoring    . GERD (gastroesophageal reflux disease)    . Low back pain    . Rash      chronic groin rash    . Difficulty in walking      uses cane occasionally   . Vision abnormalities      wears glasses   . Spinal stenosis    . Neuropathy      bl le      Past Surgical History   Procedure Date   . Carpal tunnel release      left 2008, right 2003   . Colonoscopy 09/2010   . Joint replacement 2010     left hip   . Back surgery 2006 and 2011     back surgery x 2   . Spine surgery      laminectomy 2006 & 2011         X-Rays/Tests/Labs:  H and H 11.6 and 36.6    Social History:  Prior Level of Function: independent      DME Currently at Home: RW, BSC, canes  Home Living Arrangements: with wife  Type of Home: multilevel        Subjective: Patient is agreeable to participation in the therapy session.     Patient Goal: go home today    Pain:   Scale: 7  Location: incision  Intervention: PCA    Objective:  Patient received  seated in a bedside chair  with  IV/PCA, jp drain in place.    Cognitive Status and Neuro Exam:  Intact cognition    Musculoskeletal Examination  RUE ROM: wfl  LUE ROM: wfl  RLE ROM: wfl  LLE ROM: wfl    RUE Strength: wfl  LUE Strenght: wfl  RLE Strength: wfl  LLE Strength: wfl      Functional Mobility  Rolling: independent with increased time and effort  Supine to Sit: independent with increased time and effort  Scooting: independent  Sit to Stand: independent with RW  Stand to Sit: independent      Ambulation  Ambulation Distance: 120 ft  Level of assistance required: independent  Pattern: slow cadence  Device Used: Rolling Walker  Weightbearing Status: no restrictions  Stair Management: independent with rail  Number of Stairs: 4     Balance  Static Sitting: independent  Dynamic Sitting: NT  Static Standing: independent  Dynamic Standing:  NT    Participation and Activity Tolerance  Participation Effort: 100%  Endurance: no limitations    Treatment Activities: Evaluation  Educated the patient to role of physical therapy, plan of care, goals of therapy and safety with mobility and ADLs.      Assessment: Jesus Bowman is a 55 y.o. male admitted 12/12/2010  Post spine surgical procedure. Feel pt is functioning safely and independently and has no further skilled PT needs.     Therapy Diagnosis: NA    Rehabilitation Potential: NA    Plan: [place]  Treatment/Interventions: NA    Risks/benefits/POC discussed with patient    Goals:NA       Discharge Recommendation: home  DME Recommendation: no new needs      Shary Key PT  Pager # [phone]

## 2010-12-14 NOTE — Progress Notes (Signed)
Iv site d/ced. States pain 3 out of 10. D/c instructions and dressing instructions given, patient states understanding. Dressing supplies given, back in action, constipation , wound care and dr. childs inst also given along with hardware info. Wife to drive home.

## 2010-12-14 NOTE — Consults (Signed)
[facility]     Occupational Therapy Evaluation     Patient: Jesus Bowman    MRN#: [id number]   [facility] SPINE UNIT   [id number]    Time of treatment: Time Calculation  OT Received On: 12/14/10  Start Time: 1200  Stop Time: 1235  Time Calculation (min): 35 min    Consult received for Cheri Rous Bowman for OT Evaluation and Treatment.  Patient's medical condition is appropriate for Occupational therapy intervention at this time.    Precautions and Contraindications: Falls, no bending, lifting, twisting      History of Present Illness: Jesus Bowman is a 55 y.o. male admitted on 12/12/2010.  Pt s/p FUSION, ANTERIOR LUMBAR, LATERAL POSITION LEVEL 1 - L4-L5 ANTERIOR/POSTERIOR FUSION  LAMINECTOMY, POSTERIOR LUMBAR, DECOMP, INST, FUSION LEVEL 1 - L4-L5 ANTERIOR/POSTERIOR FUSION    There is no problem list on file for this patient.       Past Medical/Surgical History:  Past Medical History   Diagnosis Date   . Hypertension    . Hyperlipidemia    . Snoring    . GERD (gastroesophageal reflux disease)    . Low back pain    . Rash      chronic groin rash    . Difficulty in walking      uses cane occasionally   . Vision abnormalities      wears glasses   . Spinal stenosis    . Neuropathy      bl le      Past Surgical History   Procedure Date   . Carpal tunnel release      left 2008, right 2003   . Colonoscopy 09/2010   . Joint replacement 2010     left hip   . Back surgery 2006 and 2011     back surgery x 2   . Spine surgery      laminectomy 2006 & 2011         X-Rays/Tests/Labs:  H&H: 11.6 and 36.6    Social History:  Prior Level of Function: Indep  DME Currently at Home: FWW, BSC, canes  Home Living Arrangements: Lives w/ wife    Type of Home: House, stairs to bedrooms      Subjective: Patient is agreeable to participation in the therapy session.     Patient Goal: Home today  Pain:   Scale: 6/10  Location: Back  Intervention: Received meds per RN    Objective:  Patient received  seated in a bedside  chair  with IV in place.    Cognitive Status and Neuro Exam:  A&O x4    Musculoskeletal Examination  Gross ROM: WFL  Gross Strength: WFL      Sensory/Oculomotor Examination  Auditory: WFL  Tactile: WFL  Vision: WFL      Activities of Daily Living  Eating: Indep  Grooming: Indep in stand w/ FWW  Bathing: Indep - simulated  UE Dressing: Indep  LE Dressing: Indep  Toileting: Indep    Functional Mobility:  Supine to Sit: NT  Sit to Stand: Indep  Transfers: Indep w/ FWW     Balance    Dynamic Standing: Fair + w/ FWW    Participation and Activity Tolerance  Participation Effort: Good  Endurance: Good    Treatment Activities: ADLs and functional mobility  Educated the patient to role of occupational therapy, plan of care, goals of therapy and safety with mobility and ADLs.  Assessment: Jesus Bowman is a 55 y.o. male admitted     Plan: D/C OT. Pt moving well. No further needs. Pt to purchase tub seat    Risks/benefits/POC discussed.                                    Discharge Recommendation: Home  DME Recommendation: tub seat as needed  Nigel Berthold, OTR

## 2010-12-14 NOTE — Progress Notes (Signed)
PROGRESS NOTE    Date Time: 12/14/2010 10:18 AM  Patient Name: Jesus Bowman  Attending Physician: Clelia Schaumann, MD    Assessment:   Stable  Plan:   D/C to home Follow up 2 weeks in office    Subjective:   No C/O pain.  Ambulation well.  Dressing changed, Drain D/C'd.    Medications:     Current Facility-Administered Medications   Medication Dose Route Frequency   . baclofen  10 mg Oral QHS   . HYDROmorphone  0.5 mg Intravenous Once   . rosuvastatin  10 mg Oral QHS   . senna-docusate  1 tablet Oral QHS   . valsartan  40 mg Oral Daily   . Vitamin D (Ergocalciferol)  50,000 Units Oral Daily   . DISCONTD: sodium chloride (PF)  3 mL Intravenous Q8H       Physical Exam:     Filed Vitals:    12/14/10 0314   BP: 103/45   Pulse: 75   Temp: 98.8 F (37.1 C)   Resp: 18         Intake/Output Summary (Last 24 hours) at 12/14/10 1018  Last data filed at 12/14/10 0800   Gross per 24 hour   Intake      0 ml   Output   1420 ml   Net  -1420 ml       Drains:     Closed/Suction Drain Posterior Bulb 10 Fr. (Active)   Site Description Clean;Dry;Intact 12/12/2010  3:00 PM   Dressing Status Clean;Gauze 12/12/2010  3:00 PM   Drainage Appearance Bloody 12/12/2010  3:00 PM   Status To bulb suction 12/12/2010  3:00 PM   Output (mL) 30 mL 12/14/2010  8:00 AM   Number of days:2       Urethral Catheter Double-lumen 16 Fr. (Removed)   Removed 12/13/10 0945   Line necessity reviewed? Yes 12/12/2010  1:35 PM   Site Assessment Clean 12/12/2010  1:35 PM   Collection Container Standard drainage bag 12/12/2010  1:35 PM   Securement Method Leg strap;Tape 12/12/2010  1:35 PM   Reason for Continuing Urinary Catheterization past POD 1 Comfort/Palliative care (pain with moving) 12/12/2010  1:35 PM   Output (mL) 575 mL 12/13/2010  8:37 AM   Number of days:1         Labs:     Lab Results   Component Value Date    WBC 4.63 11/29/2010    HGB 11.6* 12/14/2010    HCT 36.6* 12/14/2010    MCV 91.4 11/29/2010       Rads:   Radiological Procedure  reviewed.Yes          Signed by: Dewaine Oats, MD  Date/Time: 12/14/2010 10:18 AM

## 2010-12-14 NOTE — Progress Notes (Signed)
PAIN MANAGEMENT PROGRESS NOTE     Jesus Bowman is a 55 y.o. male admitted with Spinal stenosis, lumbar region, with neurogenic claudication [724.03] (SPIN [name] CLAUD)  LUMB/LUMBOSAC DISC DEGEN  284132 History of spinal [phone].    Allergies   Allergen Reactions   . Ciprofloxacin Itching   . Morphine Nausea And Vomiting       Uncomplicated recovery from anesthetic: yes    2 Days Post-Op  Procedure:Procedure(s):  FUSION, ANTERIOR LUMBAR, LATERAL POSITION LEVEL 1  LAMINECTOMY, POSTERIOR LUMBAR, DECOMP, INST, FUSION LEVEL 1 12/12/2010 - 12/13/2010      History   PMH:   Past Medical History   Diagnosis Date   . Hypertension    . Hyperlipidemia    . Snoring    . GERD (gastroesophageal reflux disease)    . Low back pain    . Rash      chronic groin rash    . Difficulty in walking      uses cane occasionally   . Vision abnormalities      wears glasses   . Spinal stenosis    . Neuropathy      bl le     PSH:   Past Surgical History   Procedure Date   . Carpal tunnel release      left 2008, right 2003   . Colonoscopy 09/2010   . Joint replacement 2010     left hip   . Back surgery 2006 and 2011     back surgery x 2   . Spine surgery      laminectomy 2006 & 2011     BMI: Body mass index is 38.84 kg/(m^2).    Home Pain Regimen:   Prescriptions prior to admission   Medication Sig   . Ascorbic Acid (VITAMIN C PO) Take 1 tablet by mouth daily.     Marland Kitchen b complex vitamins tablet Take 1 tablet by mouth every other day.     . baclofen (LIORESAL) 10 MG tablet Take 10 mg by mouth nightly.     . diclofenac (VOLTAREN) 50 MG EC tablet Take 50 mg by mouth daily. Take 2 tabs    . HYDROcodone-acetaminophen (VICODIN) 5-500 MG per tablet Take 1 tablet by mouth every 6 (six) hours as needed.     . IRON PO Take 1 tablet by mouth every other day.     Marland Kitchen MAGNESIUM PO Take 1 tablet by mouth every other day.     . Misc Natural Products (PROSTATE SUPPORT PO) Take 1 tablet by mouth daily.     . Multiple Vitamins-Minerals (ZINC PO)  Take 1 tablet by mouth every other day.     . olmesartan (BENICAR) 40 MG tablet Take 40 mg by mouth daily.     . rosuvastatin (CRESTOR) 10 MG tablet Take 10 mg by mouth nightly.     Marland Kitchen VITAMIN D, ERGOCALCIFEROL, PO Take 1 tablet by mouth every other day.           Labs     Recent Labs   Basename 12/14/10 0322    WBC --    HGB 11.6*    HCT 36.6*       No results found for this basename: BUN,CREAT,AST,ALT,ALP,BILIT in the last 24 hours    No results found for this basename: INR in the last 24 hours    No results found for this basename: PT,PTT in the last 24 hours  Medication     Current Facility-Administered Medications   Medication Dose Route Frequency   . baclofen  10 mg Oral QHS   . HYDROmorphone  0.5 mg Intravenous Once   . rosuvastatin  10 mg Oral QHS   . senna-docusate  1 tablet Oral QHS   . sodium chloride (PF)  3 mL Intravenous Q8H   . valsartan  40 mg Oral Daily   . Vitamin D (Ergocalciferol)  50,000 Units Oral Daily       PRN Meds: acetaminophen, naloxone, zolpidem      CONTINUOUS INFUSIONS:       . dextrose 5 % and 0.45 % NaCl with KCl 40 mEq 100 mL/hr at 12/13/10 2152   . HYDROmorphone     . lactated ringers 100 mL/hr at 12/12/10 1324       Drug:Dilaudid   Continuous Rate:0  PCA Demand Dose: 0.3mg      Lockout Interval: 10     Max Doses Per Hour:6    Usage:2.4mg /12h      On Q pump: no    Assessment:     Filed Vitals:    12/14/10 0314   BP: 103/45   Pulse: 75   Temp: 98.8 F (37.1 C)   Resp: 18       Alert and oriented: yes  Foley: no  Voiding: yes  Chest tube: no  Itching: no  Nausea: no  Vomiting: no    Pain:  Pain Assessment  Pain Assessment: Numeric Scale (0-10) (12/14/10 0936)  Pain Score: 4-moderate pain (4-8/10) (12/14/10 0936)  POSS Score: Awake and Alert (12/14/10 0936)  Pain Location: Back (12/14/10 0936)  Pain Orientation: Lower (12/14/10 0936)  Pain Descriptors: Lambert Mody (with movement) (12/14/10 0936)  Pain Frequency: Intermittent (12/14/10 0936)  Effect of Pain on Daily Activities: mild  (12/14/10 0936)  Patient's Stated Comfort Functional Goal: 2-mild pain (12/14/10 0730)  Pain Intervention(s): Medication (See eMAR) (12/14/10 0936)  Multiple Pain Sites: No (12/14/10 0936)                          Respiratory Assessment:  Oxygen Therapy  SpO2: 94 % (12/14/10 0936)  O2 Device: None (Room air) (12/14/10 0936  Comment:  Pt denies OSA hx. BmI is38.8 and hx of snoring    Nutrition/GI Status:regular    General          Plan     Impression:doing well, encouraged IS q hr     POST-OPERATIVE PAIN    SOMATIC PAIN  CHRONIC PAIN      Patient or surrogate educated re: safe use of PCA and/or pain medication.     Patient or surrogate verbalizes or demonstrates understanding.      Additional Comments: discussed with RN hx of snoring and BMI--to assess resp status when pt is napping    Plan: D/C PCA, Percocet 1-2 po 4 hrs prn pain, IV Dilaudid 0.5mg  q 4 h prn severe breakthrough pain hold for sedation            Note was completed by Wyn Quaker 12/14/2010 9:41 AM

## 2010-12-15 NOTE — Op Note (Deleted)
PREOPERATIVE DIAGNOSES:  1.  Lumbar spinal stenosis, L4-L5.  2.  Degenerative disk disease, L4-L5.  3.  Status post laminotomy, foraminotomy, diskectomy, L4-L5 right.     POSTOPERATIVE DIAGNOSES:  1.  Lumbar spinal stenosis, L4-L5.  2.  Degenerative disk disease, L4-L5.  3.  Status post laminotomy, foraminotomy, diskectomy, L4-L5 right.

## 2010-12-15 NOTE — Op Note (Signed)
FIRST ASSISTANT:  Daralene Milch.     PREOPERATIVE DIAGNOSES:  1.  Lumbar spinal stenosis, L4-L5.  2.  Degenerative disk disease, L4-L5.  3.  Status post laminotomy, foraminotomy, diskectomy L4-L5 x2.     POSTOPERATIVE DIAGNOSES:  1.  Lumbar spinal stenosis, L4-L5.  2.  Degenerative disk disease status post laminotomy, foraminotomy,  diskectomy L4-L5 x2.     TITLES OF PROCEDURES:  1.  Laminectomy, L4-L5.   2.  Posterior fusion L4-L5.  3.  Spinal instrumentation, L4-L5 [facility] system).  4.  Aspiration of bone marrow, iliac crest.  5.  Bone grafting (local laminar fragments, cortical cancellous allograft,  and OsteoSponge with bone marrow aspirate).  6.  Attempted anterior fusion via the lateral approach.     ANESTHESIA:  General.     COMPLICATIONS:  None.     DRAINS:  One Jackson-Pratt to the subfascial space.     INDICATIONS FOR PROCEDURE:  The patient is a 55 year old gentleman with back and leg pain secondary to  the above diagnoses who presents now for surgery as nonoperative measures  have failed to alleviate his symptoms.  He has bilateral leg pain, much  worse on the right than the left.  Preoperatively, the patient was  counseled regarding indications for procedure and potential complications.   He was also advised that should it be proven unable to perform the anterior  fusion with the direct lateral approach, we would proceed with the  posterior decompression and fusion which is what occurred.     DESCRIPTION OF PROCEDURE:  The patient received prophylactic antibiotics in the holding area.  He was  then brought to the operating room.  After the induction of anesthesia,  sequential compression devices were applied to both legs.  A Foley catheter  was placed.  The patient was then placed in lateral decubitus position with  the left side superior.  He was then anchored to the table in the usual  fashion with tape and straps.  Care was taken to ensure that we had  excellent AP and lateral fluoroscopic views of the  L4-L5 segment.   Utilizing lateral fluoroscopic view, the incision level was marked with a  pen.  The back and flank were then prepped and draped in the usual fashion.   The direct lateral incision was made.  This was approximated 2.5 cm.   Dissection carried sharply through the skin and subcutaneous tissue.  The  fascia was incised in line with the incision and interval in the  retroperitoneal space was then bluntly dissected by finger.  Once  retroperitoneal space was entered, the psoas muscle was palpated and  anterior sweeping motion was made to ensure that we had a safe trajectory  into the psoas muscle.  The initial dissection was placed onto the psoas  muscle and position was checked in AP and lateral fluoroscopic views.  It  was noted that due to the patient's high crest, even though the patient was  positioned with the table broken at the iliac crest.  We could not obtain a  safe trajectory to the disk space.  At this junction, it was determined to  abandon the lateral approach for the anterior fusion and proceed with  posterior decompression and fusion.  The wound was irrigated.  The fascia  was repaired with #1 Vicryl in a figure-of-eight stitch.  Subcutaneous  tissue was repaired with 2-0 Vicryl.  Skin was closed in a subcuticular  fashion.  Sterile dressing was applied.  After  a sterile dressing, a  watertight dressing was applied.  The patient was then transferred to a  separate bed onto a Wilson frame in a prone position.  Care was taken to  ensure his abdomen was free of compression and all bony prominences were  well padded.  The back was prepped and draped in usual fashion.     A vertical midline incision was made incorporating the patient's previous  incision, dissection carried sharply through the skin and subcutaneous  tissue.  Hemostasis was obtained with use of Bovie.  The paraspinal  musculature was carefully and meticulously mobilized out to the tips of the  transverse processes from L4 to  the sacrum.  This involved meticulous  dissection at the site of previous surgery at L4-L5 right.  Excellent  exposure was obtained.  The patient had a very deep wound into the body  habitus.  Deep retractors were placed in the wound.  The posterolateral  intertransverse region was well exposed and epinephrine-soaked sponges were  placed here.  Utilizing a Leksell rongeur, the spinous process of L4 was  resected, the superior aspect of L5, and the inferior aspect of L3 were  resected.  The central lamina was then thinned with the Leksell rongeur.   Laminectomy was completed with a Kerrison rongeur.  This involved  meticulous dissection and release at the site of previous decompressive  surgery on the right as the dura was markedly scarred up here.  At the  completion of this decompression, medial borders of pedicles of L4 and L5  could be palpated bilaterally and the L5 nerve roots could be followed  freely with a Public house manager and a Public house manager could also pass  freely out the L4 foramen bilaterally.  The bone taken during the  decompression was meticulously cleared of soft tissue and morselized for  use in the fusion.  The superior aspect of L5 was resected as well as a  portion of the inferior aspect of L3, completely freeing up the spinal  canal.  At this junction, a trocar was introduced into the iliac crest and  bone marrow was aspirated in standard fashion.  The trocar was withdrawn.   Pressure was held until bleeding stopped.  The bone marrow was used to  saturate an OsteoSponge.  This was mixed with the patient's local bone and  cortical cancellous allograft to create excellent fusion mass.  At this  junction, beginning left, the pedicles of L3, L4 and L5 were entered with a  Midas Rex, hand probed to a depth of 45 mm, and tapped with a 5.5 mm tap.   The holes were palpated and gave evidence of good bony contact in all  planes anteriorly, medially, laterally, superiorly, and inferiorly.  Screws  were  inserted at L4 and L5 in this fashion.  Both screws achieved excellent  purchase.  They were stimulated and gave no evidence of nerve root  irritation.  Precut lordotic rod was placed and the screws heads locked in  the usual fashion.  Meticulous decortication was then performed at the  transverse processes of L4 and L5, the facet joints at L4-L5, and the pars  interarticularis of L4 and L5.  The screws were then inserted on the right  side at L4-L5 in the exact same fashion.  Both achieved excellent purchase.   They were stimulated and gave no evidence of nerve root irritation as  well.  It should be noted the medial borders of the pedicles at  each were  palpated with pedicle feeler after screws were inserted.  There was no  evidence of any medial breech.  Meticulous decortication was then performed  at the transverse process of L4 and L5 on the right, the pars  interarticularis bilaterally, and the facet joint as well.  After the  screws were inserted, a precut lordotic rod was placed into the screw  heads, locked in the usual fashion.  The dura was protected with a pattie.   The bone graft was placed into the intertransverse region bilaterally  creating excellent fusion mass.  The patties were removed.  The canal was  checked.  There was no evidence of any bone graft migration into the canal.   The wound was irrigated prior at this junction.  The exposed dura was  covered with Avitene slurry followed by Gelfoam.  A Jackson-Pratt drain was  placed in the subfascial space.  The wound was then closed at the level of  the fascia with #1 Vicryl in a figure-of-eight stitch.  Subcutaneous tissue  was repaired with 2-0 Vicryl.  The skin was closed in a subcuticular  fashion and augmented with Steri-Strips.  Sterile dressing was applied.   The patient tolerated the procedure well with no complications.  He was  transferred to his bed and then to recovery room without incident.

## 2010-12-24 NOTE — Discharge Summary (Signed)
ADMITTING DIAGNOSES:  1.  Lumbar radiculopathy L4-L5.  2.  Degenerative disk disease, L4-L5.     DISCHARGE DIAGNOSES:  1.  Lumbar radiculopathy, L4-L5.  2.  Degenerative disk disease, L4-L5.     HISTORY OF PRESENT ILLNESS:  As per the chart, but in brief, we have a 55 year old gentleman with  persistent back and leg pain who presents now for surgery as nonoperative  measures have failed to alleviate his symptoms.     REVIEW OF SYSTEMS:  Remarkable for a history of hypertension, hyperlipidemia, snoring, anemia,  gastroesophageal reflux, and neuropathy.     ALLERGIES:  CIPRO and MORPHINE.     PHYSICAL EXAMINATION:  GENERAL:  He is a well-developed, well-nourished male, alert and oriented  x3.  He responds appropriately to questions.  CHEST:  Clear to auscultation.  HEART:  Reveals regular rate and rhythm.  ABDOMEN:  Soft, normoactive bowel sounds.  NEUROLOGIC:  He is intact neurologically.     HOSPITAL COURSE:  The patient was taken to the operating room on the day of admission.  At  that time he underwent a laminectomy and fusion of L4-L5.  Postoperative  day 1 the patient had the expected incisional pain.  His wound was clean  and dry.  Physical therapy was initiated.  He tolerated this well.  He was  moving his lower extremities well.  On postoperative day 2, the patient  continued to improve.  He had the expected incisional pain.  He was  ambulating well.  His dressing was changed.  His drain was removed.  He had  no complaints of pain at that time.  He was discharged home with  instructions to lift nothing greater than 10 pounds, to change his  dressings daily, and to follow up in the office as scheduled.

## 2011-01-02 NOTE — Op Note (Signed)
MRN: [id number] DOCUMENT ID: [id number]      INTRODUCTION:      55 YEAR OLD MALE PATIENT PRESENTS FOR A COLONOSCOPY.  THE INDICATIONS FOR      THE PROCEDURE WERE A POSITIVE FAMILY HISTORY OF COLON CANCER AND FOR A      HISTORY OF COLONIC POLYPS.      CONSENT:      THE BENEFITS, RISKS, AND ALTERNATIVES TO THE PROCEDURE WERE DISCUSSED AND      INFORMED CONSENT WAS OBTAINED.      PREPARATION:      EKG, PULSE, PULSE OXIMETRY AND BLOOD PRESSURE MONITORED.      MEDICATIONS:      - MAC ANESTHESIA WAS ADMINISTERED      PROCEDURE:      RECTAL EXAM: NO EXTERNAL HEMORRHOIDS.      THE ENDOSCOPE WAS PASSED WITH EASE UNDER DIRECT VISUALIZATION TO THE      TERMINAL ILEUM CONFIRMED BY LANDMARKS, APPENDICEAL ORIFICE AND ILEOCECAL      VALVE.  RETROFLEXION WAS PERFORMED IN THE RECTUM.  THE QUALITY OF THE      PREPARATION WAS GOOD.  THE STUDY WAS PERFORMED WITH A PCF-H180AL.      FINDINGS:  IN THE CECUM THERE WAS A A QUESTION OF 3 VERY SUBTLE MINIMALLY      ERYTHEMATOUS LESIONS POSSIBLY REPRESENTING SMALL NON BLEEDING      ANGIODYSPLASIAS .  MINIMAL, EARLY DIVERTICULOSIS IN SIGMOID COLON.      MODERATE NON-BLEEDING INTERNAL HEMORRHOIDS WERE PRESENT.  THE TI WAS      INTUBATED AND APPEARED NORMAL FOR 10 CM.  THE COLONOSCOPY WAS OTHERWISE      NORMAL.  WITHDRAWAL TIME > 8 MIN WITH CAREFUL INSPECTION OF PROXIMAL AND      DISTAL ASPECTS OF EACH FOLD.      COMPLICATIONS:      THERE WERE NO COMPLICATIONS ASSOCIATED WITH THE PROCEDURE.      IMPRESSION:      1.  IN THE CECUM THERE WAS A A QUESTION OF 3 VERY SUBTLE MINIMALLY      ERYTHEMATOUS LESIONS POSSIBLY REPRESENTING SMALL NON BLEEDING      ANGIODYSPLASIAS .      2.  MINIMAL, EARLY DIVERTICULOSIS IN SIGMOID COLON.      3.  MODERATE NON-BLEEDING INTERNAL HEMORRHOIDS [455.0].      4.  THE TI WAS INTUBATED AND APPEARED NORMAL FOR 10 CM.      5.  COLONOSCOPY, OTHERWISE NORMAL.  WITHDRAWAL TIME > 8 MIN WITH CAREFUL      INSPECTION OF PROXIMAL AND DISTAL ASPECTS OF EACH FOLD.      RECOMMENDATION:       - DIVERTICULOSIS DIET, FIBER SUPPLEMENTATION, AND POTENTIAL COMPLICATIONS      AS DISCUSSED.      - PATIENT INSTRUCTED TO REPEAT COLONOSCOPY IN 3-5 Y.      - FOLLOW UP IN OFFICE AS NEEDED.      - FOLLOW UP WITH DR. AL-DALI AS DIRECTED.      SIGNING PHYSICIAN: Halina Andreas

## 2011-01-16 HISTORY — PX: BACK SURGERY: SHX140

## 2012-01-07 NOTE — Op Note (Unsigned)
DATE OF SURGERY:                    02/17/2001            SURGEON:                            Lilyan Punt, MD            DICTATED BY:                        Lilyan Punt, MD            PREOPERATIVE DIAGNOSIS:   CARPAL TUNNEL SYNDROME, RIGHT WRIST.            POSTOPERATIVE DIAGNOSIS:   CARPAL TUNNEL SYNDROME, RIGHT WRIST.            PROCEDURE:  CARPAL TUNNEL RELEASE NEUROLYSIS MEDIAN NERVE, RIGHT WRIST.            ANESTHESIA:  Bier block.            DESCRIPTION OF PROCEDURE:  With the patient  in  the supine position on the      operating room table, satisfactory Bier  block  anesthesia  was obtained in      the right arm, and the patient's right  arm  was then prepped and draped in      the usual sterile fashion.  A curved  incision  was then made over the base      of the palm, extending across the flexor  crease  of the wrists in a peaked      fashion.  The incision was carried through  the  subcutaneous  plane to the      transverse carpal ligament.  A small  longitudinal,  2-3 mm length incision      was made in the ligament, visualizing the nerve below.  A hemostat was then      placed below the ligament, between the  ligament  and  the nerve.  With the      nerve protected, the proximal portion  of  the ligament was transected with      dissecting scissors.  The hemostat was  then placed under the distal aspect      of the nerve between the ligament again  protecting  the nerve and then, by      sharp dissection advancing several mm  incrementally,  with the incision in      the transverse carpal ligament was completely  transected  into the base of      the palm.  At this point a satisfactory  release  of  the transverse carpal      ligament was felt to be obtained. A neurolysis  was  then performed teasing      the epineurium from the nerve and then  gently  spreading  and incising the      epineurium  to fully decompress the  nerve.  The  nerve  was  noted  to  be      flattened and hyperemic in the  region  of the maximum compression and there      was considerable thickening of the transverse  carpal ligament at the point      of maximum compression.  Inspection of  the  motor branch distally revealed  no additional compression about the branch.  At this point it was felt that      a satisfactory nerve decompression had been obtained.            The  wound  was irrigated. Closure was  accomplished  with  4-0  Vicryl  in      interrupted simple stitches to close  the  subcuticular  layer and the skin      closed with interrupted 5-0 nylon, including a corner stitch at the peak of      the incision across the flexion crease.  Following this, a sterile dressing      with Betadine ointment, Adaptic, 4 x  4  and  sterile  Webril  was applied,      followed  by  a volar fiberglass splint  and  Ace  wraps.  Release  of  the      tourniquet demonstrated good vascular filling of the extremity. The patient      tolerated  the  procedure  well and was  taken  to  the  recovery  room  in      satisfactory condition. There were no complications. The sponge, instrument      and needle counts were correct.                                                                    _____________________________________                                            _____                                            Lilyan Punt, MD      [id number]      D: 02/17/2001 3:16 P      T: 02/18/2001  8:06 A      J: [id number]      N: [id number]      CC: Lilyan Punt, MD

## 2012-01-08 NOTE — Op Note (Unsigned)
DATE OF SURGERY:                     04/03/2002            SURGEON:                            Niel Hummer, MD            ASSISTANT(S):                  DICTATED BY:                        Niel Hummer, MD            DATE OF BIRTH:  1955-09-19            PREOPERATIVE DIAGNOSIS:  FAMILY HISTORY OF COLON CANCER.            POSTOPERATIVE DIAGNOSIS:      1.   COLON POLYPS.      2.   INTERNAL HEMORRHOIDS.      3.   DIVERTICULOSIS.            PROCEDURE:  COLONOSCOPY.            INTRAVENOUS SEDATION:  Versed 5-mg intravenous, Fentanyl 100-mcg      intravenous.            DESCRIPTION OF PROCEDURE:  History and physical examination were performed.      The indications and potential complications were reviewed, and the patient      provided informed consent.  He was laid in the left lateral decubitus      position and intravenous sedation given as above. Digital rectal      examination was unremarkable.  A standard Olympus colonoscope was inserted      into the rectum and advanced to the cecum easily.  The cecum was identified      by the ileocecal valve and appendiceal orifice.  Colon prep was good.  A      careful examination during slow withdrawal revealed a  mild sigmoid colon      diverticulosis.  Two small diverticular openings were seen.  A single, 3-mm      diminutive polyp was seen in the sigmoid colon which was successfully cold      biopsied with forceps with good hemostasis.  Retroflexion in the rectum      revealed grade 1 internal hemorrhoids.  The procedure was then terminated.      It was tolerated well by the patient.  There were no immediate      complications.            RECOMMENDATIONS:      1.   Follow up colon biopsy results.      2.   High fiber diet.      3.   Colonoscopy in three years.                              __________________________________________ __________      Niel Hummer, MD                       Date            A [phone]      D: 04/03/2002  9:52 A  T:  04/03/2002  1:18 P       J:  [id number]      N: [id number]      CC:  Renette Butters, MD          Niel Hummer, MD

## 2012-01-18 NOTE — Op Note (Unsigned)
ROOM NUMBER:                           [id number]            SURGEON:                                Clelia Schaumann, MD            DATE:                                   08/02/2004                  PREOPERATIVE DIAGNOSES:      1.   Lumbar disc herniation L4-L5 left.      2.   Sciatica.            POSTOPERATIVE DIAGNOSES:      1.   Lumbar disc herniation L4-L5 left.      2.   Sciatica.            OPERATION:  Laminotomy, discectomy L4-L5 left.            ANESTHESIA:  General.            COMPLICATIONS:  None.            DRAINS:  None.            INDICATIONS FOR PROCEDURE:  The patient is a 57 year old gentleman with      back and leg pain secondary to above diagnosis, presents now for surgery as      nonoperative measures have failed to alleviate his symptoms.      Preoperatively, he was counseled regarding the indications for procedure,      potential complications.            PROCEDURE:  The patient received prophylactic antibiotics in the holding      area, was then brought to the operating room.  After induction of      anesthesia, sequential compression devices were applied to both legs.  He      was then carefully placed in a prone position on the Wilson frame.  Care      was taken to assure his abdomen was free of compression.  All bony      prominences were well padded.  The back was then prepped and draped in      usual fashion.  A vertical midline incision was made.  Dissection carried      sharply through the skin and subcutaneous tissue.  Hemostasis was obtained      with the use of Bovie.  The paraspinal musculature was carefully and      meticulous mobilized out to the lateral border of the facet joint.  The      appropriate level was confirmed with x-ray.  A Taylor retractor was placed      at the lateral border of the facet joint.  At this junction, a Midas Rex      was used to thin the inferior 1/2 of the L4 lamina.  Laminotomy was      completed here with a Kerrison rongeur.  The ligamentum flavum  was resected      from the intralaminar space, and a generous laminotomy was performed at  L5.      The bony decompression continued out to the medial border of the L5      pedicle.  The L5 nerve root was visualized, gently retracted medially.      There were copious epidural veins _____ were cauterized with bipolar      cautery.  The disc space was visualized and excised with a knife.  Multiple      passes were made with a pituitary rongeur removing fragments of disc      material.  At the completion of this decompression, a Woodson elevator      could pass freely up the L4 foramen, freely under the L5 nerve root, and      freely under the cauda equina.  The wound was copiously irrigated.  _____      impingement.  The exposed dura was covered with Gelfoam soaked in thrombin.      The wound was then closed at the level of the fascia with #1 Vicryl in a      figure-of-eight stitch.  The subcutaneous tissue was repaired with 2-0      Vicryl.  The skin was closed in a subcuticular fashion with 4-0 Vicryl      augmented with Steri-Strips and Mastisol.  A sterile dressing was applied.      The patient tolerated the procedure well.  There were no complications.      The incision was injected with a solution of 0.5% Marcaine with epinephrine      prior to placement of dressing.  The patient was then transferred to his      bed, and then to recovery room without incident.                                          ______________________________     Date Signed: __________      Clelia Schaumann, MD            [id number]      Dictated:    08/02/2004 11:21 P      Transcribed: 08/03/2004 10:53 A      Job Number: [id number]      Document Number: [id number]            CC:  Renette Butters, MD           Clelia Schaumann, MD

## 2013-03-30 HISTORY — PX: EYE SURGERY: SHX253

## 2014-10-29 ENCOUNTER — Other Ambulatory Visit: Payer: Self-pay

## 2014-10-29 DIAGNOSIS — Z01818 Encounter for other preprocedural examination: Secondary | ICD-10-CM

## 2014-11-09 ENCOUNTER — Ambulatory Visit: Payer: Medicare Other

## 2014-11-09 NOTE — Pre-Procedure Instructions (Signed)
   Pt to have pre-op appt with Dr. Milagros Evener (PCP) on 11/09/14, pt will bring pre-op check list that includes Medical clearance/Labs/CXR/MRSA/MSSA/UA/EKG, TOR Nav to f/u when complete   NPO guidelines reviewed and verbalized understanding with pt   Pt to watch spine video, per pt "4th surgery"   CHG reviewed and verbalized understanding with pt   Pt's wife to bring DOS/Home   Hand off to TOR Nav for PST review

## 2014-11-10 ENCOUNTER — Other Ambulatory Visit: Payer: Self-pay | Admitting: Internal Medicine

## 2014-11-10 NOTE — Pre-Procedure Instructions (Signed)
   Fax to pcp for labs/ ekg/clearance/mrsa/cxr to be faxed to [phone]

## 2014-11-12 NOTE — Pre-Procedure Instructions (Addendum)
   Call to pcp office to inquire about mrsa/mssa, recording states office closed for lunch until 1330.   Addendum 11/15/14@1037  call to pcp spoke with Camelia Eng, she will fax mrsa results to pss [phone].

## 2014-11-16 NOTE — Pre-Procedure Instructions (Signed)
preop TED order reviewed.  Ancef not entered/pt has allergy

## 2014-11-17 NOTE — Plan of Care (Signed)
Problem: Health Promotion  Goal: Risk control - tobacco abuse  Actions to eliminate or reduce tobacco use.  Outcome: Completed Date Met:  11/17/14  Quit smoking December 01, 1998

## 2014-11-17 NOTE — Discharge Instructions (Addendum)
Back in Action Spine Surgery booklet to be provided and reviewed -    Spine Wound Care information sheet to be provided and reviewed -    Constipation Information sheet to be provided and reviewed   -    Mixing alcohol and pain medications can cause dizziness and slow your breathing.    Don't drink alcoholic beverages while taking pain medications.           Edman Circle. Marjo Bicker, MD  Orthopaedic Spine Surgery    Lumbar Fusion Surgery------- Removal of Hardware   Discharge Instructions    WOUND CARE:    Your wound will be closed with absorbable stitches and covered with steri-strips, gauze and tape. Change the gauze dressing daily for seven days after surgery. If you notice any swelling, redness, or excessive drainage, call our office at [phone]. You may shower on the 5th day after surgery. Use caution to avoid letting the water pound on your incision. Do not soak your incision in the bath tub, hot tub, or pool until 4 weeks after surgery. You may purchase dressing supplies at [employer] located in the west corridor of the [employer].    ACTIVITY:    While in the hospital, you will be seen by occupational and physical therapists who will help you to sit in a chair and start walking and learn to climb stairs. After two to three days, you will be discharged to go home. You are encouraged to walk and change positions frequently. Some patients may need to have a physical therapist come to their home for a few visits. Some patients may need a walker and/or a bedside commode for a short time after arriving home. Do not bend, twist, or lift anything greater than a gallon of milk until re-evaluation. Do not use anti-inflammatory medications for two months after surgery. You may drive when you feel comfortable and are no longer using narcotic medications. Your doctor will discuss with you when you will be able to return to work.    FOLLOW UP:    You will be given a post-op appointment at two weeks after surgery at  which time X-rays will be taken. You will start physical therapy at 3 months after surgery. Should you have further questions or concerns at any point during your recovery, please do not hesitate to contact our office at [phone].    [address]  [address]  [place], [place]    [address]  Phone: [phone]  [phone]            Discharge Instructions: After Your Surgery  You've just had surgery. During surgery you were given medicine called anesthesia to keep you relaxed and free of pain. After surgery you may have some pain or nausea. This is common. Here are some tips for feeling better and getting well after surgery.    Going home  Your doctor or nurse will show you how to take care of yourself when you go home. He or she will also answer your questions. Have an adult family member or friend drive you home. For the first 24 hours after your surgery:   Do not drive or use heavy equipment.   Do not make important decisions or sign legal papers.   Do not drink alcohol.   Have someone stay with you, if needed. He or she can watch for problems and help keep you safe.  Be sure to go to all follow-up visits with your doctor. And rest after your  surgery for as long as your doctor tells you to.  Coping with pain  If you have pain after surgery, pain medicine will help you feel better. Take it as told, before pain becomes severe. Also, ask your doctor or pharmacist about other ways to control pain. This might be with heat, ice, or relaxation. And follow any other instructions your surgeon or nurse gives you.  Tips for taking pain medicine  To get the best relief possible, remember these points:   Pain medicines can upset your stomach. Taking them with a little food may help.   Most pain relievers taken by mouth need at least 20 to 30 minutes to start to work.   Taking medicine on a schedule can help you remember to take it. Try to time your medicine so that you can take it before starting an activity.  This might be before you get dressed, go for a walk, or sit down for dinner.   Constipation is a common side effect of pain medicines. Call your doctor before taking any medicines such as laxatives or stool softeners to help ease constipation. Also ask if you should skip any foods. Drinkinglots of fluids andeating foodssuch as fruits and vegetables that are high in fiber can also help. Remember, do not take laxatives unless your surgeon has prescribed them.   Drinking alcohol and taking pain medicine can cause dizziness and slow your breathing. It can even be deadly. Do not drink alcohol while taking pain medicine.   Pain medicine can make you react more slowly to things. Do not drive or run machinery while taking pain medicine.  Your health care providermay tell you to take acetaminophen to help ease your pain. Ask him or her how much you are supposed to take each day. Acetaminophen or other pain relievers may interact with your prescription medicines or other over-the-counter (OTC) drugs. Some prescription medicines have acetaminophen and other ingredients.Using both prescription and OTC acetaminophenfor paincan cause you to overdose. Readthe labels on your OTC medicineswith care. This will help youto clearly know the list of ingredients, how much to take, and anywarnings. It may also help you not take too muchacetaminophen.If you have questions or do not understand the information, ask your pharmacist or health care provider to explain it to you before you take the OTC medicine.  Managing nausea  Some people have an upset stomach after surgery. This is often because of anesthesia, pain, or pain medicine, or the stress of surgery. These tips will help you handle nausea and eat healthy foods as you get better. If you were on a special food plan before surgery, ask your doctor if you should follow it while you get better. These tips may help:   Do not push yourself to eat. Your body will tell you  when to eat and how much.   Start off with clear liquids and soup. They are easier to digest.   Next try semi-solid foods, such as mashed potatoes, applesauce, and gelatin, as you feel ready.   Slowly move to solid foods. Don't eat fatty, rich, or spicy foods at first.   Do not force yourself to have 3 large meals a day. Instead eat smaller amounts more often.   Take pain medicines with a small amount of solid food, such as crackers or toast, to avoid nausea.         If you have obstructive sleep apnea  You were given anesthesia medicine during surgery to keep you comfortable and  free of pain. After surgery, you may have more apnea spells because of this medicine and other medicines you were given. The spells may last longer than usual.  At home:   Keep using the continuous positive airway pressure (CPAP) device when you sleep. Unless your health care provider tells you not to, use it when you sleep, day or night. CPAP is a common device used to treat obstructive sleep apnea.   Talk with your provider before taking any pain medicine, muscle relaxants, or sedatives. Your provider will tell you about the possible dangers of taking these medicines.   [address] [employer] [address] [place], [place] [address]. All rights reserved. This information is not intended as a substitute for professional medical care. Always follow your healthcare professional's instructions.

## 2014-11-17 NOTE — Pre-Procedure Instructions (Signed)
Dr Childs' preop orders noted/verified.

## 2014-11-18 ENCOUNTER — Encounter
Admission: RE | Disposition: A | Discharge status: Home / Self Care | Payer: Self-pay | Source: Ambulatory Visit | Attending: Orthopaedic Surgery

## 2014-11-18 ENCOUNTER — Inpatient Hospital Stay: Payer: Medicare Other | Admitting: Orthopaedic Surgery

## 2014-11-18 ENCOUNTER — Other Ambulatory Visit: Payer: Self-pay

## 2014-11-18 ENCOUNTER — Inpatient Hospital Stay: Payer: Medicare Other | Admitting: Certified Registered"

## 2014-11-18 ENCOUNTER — Inpatient Hospital Stay
Admission: RE | Admit: 2014-11-18 | Discharge: 2014-11-18 | DRG: 496 | Disposition: A | Discharge status: Home / Self Care | Payer: Medicare Other | Source: Ambulatory Visit | Attending: Orthopaedic Surgery | Admitting: Orthopaedic Surgery

## 2014-11-18 ENCOUNTER — Inpatient Hospital Stay: Payer: Medicare Other

## 2014-11-18 DIAGNOSIS — M1990 Unspecified osteoarthritis, unspecified site: Secondary | ICD-10-CM | POA: Diagnosis present

## 2014-11-18 DIAGNOSIS — T84216A Breakdown (mechanical) of internal fixation device of vertebrae, initial encounter: Secondary | ICD-10-CM | POA: Diagnosis present

## 2014-11-18 DIAGNOSIS — R262 Difficulty in walking, not elsewhere classified: Secondary | ICD-10-CM | POA: Diagnosis present

## 2014-11-18 DIAGNOSIS — I10 Essential (primary) hypertension: Secondary | ICD-10-CM | POA: Diagnosis present

## 2014-11-18 DIAGNOSIS — K219 Gastro-esophageal reflux disease without esophagitis: Secondary | ICD-10-CM | POA: Diagnosis present

## 2014-11-18 DIAGNOSIS — M545 Low back pain: Secondary | ICD-10-CM | POA: Diagnosis present

## 2014-11-18 DIAGNOSIS — G8929 Other chronic pain: Secondary | ICD-10-CM | POA: Diagnosis present

## 2014-11-18 DIAGNOSIS — R32 Unspecified urinary incontinence: Secondary | ICD-10-CM | POA: Diagnosis present

## 2014-11-18 DIAGNOSIS — Q233 Congenital mitral insufficiency: Secondary | ICD-10-CM

## 2014-11-18 DIAGNOSIS — E785 Hyperlipidemia, unspecified: Secondary | ICD-10-CM | POA: Diagnosis present

## 2014-11-18 DIAGNOSIS — Y838 Other surgical procedures as the cause of abnormal reaction of the patient, or of later complication, without mention of misadventure at the time of the procedure: Secondary | ICD-10-CM | POA: Diagnosis present

## 2014-11-18 DIAGNOSIS — Z87891 Personal history of nicotine dependence: Secondary | ICD-10-CM

## 2014-11-18 DIAGNOSIS — T8484XA Pain due to internal orthopedic prosthetic devices, implants and grafts, initial encounter: Secondary | ICD-10-CM | POA: Diagnosis present

## 2014-11-18 DIAGNOSIS — Z981 Arthrodesis status: Secondary | ICD-10-CM

## 2014-11-18 DIAGNOSIS — G629 Polyneuropathy, unspecified: Secondary | ICD-10-CM | POA: Diagnosis present

## 2014-11-18 HISTORY — DX: Congenital mitral insufficiency: Q23.3

## 2014-11-18 HISTORY — DX: Tinnitus, unspecified ear: H93.19

## 2014-11-18 HISTORY — PX: REMOVAL, POSTERIOR LUMBAR SPINE HARDWARE: SHX5157

## 2014-11-18 HISTORY — DX: Unspecified osteoarthritis, unspecified site: M19.90

## 2014-11-18 HISTORY — DX: Unspecified urinary incontinence: R32

## 2014-11-18 SURGERY — REMOVAL, SPINE HARDWARE, LUMBAR, POSTERIOR
Anesthesia: Anesthesia General | Site: Spine Lumbar | Wound class: Clean

## 2014-11-18 MED ORDER — FAMOTIDINE 20 MG/2ML IV SOLN
INTRAVENOUS | Status: AC
Start: 2014-11-18 — End: ?
  Filled 2014-11-18: qty 2

## 2014-11-18 MED ORDER — ONDANSETRON HCL 4 MG/2ML IJ SOLN
INTRAMUSCULAR | Status: AC
Start: 2014-11-18 — End: ?
  Filled 2014-11-18: qty 2

## 2014-11-18 MED ORDER — PROMETHAZINE HCL 25 MG/ML IJ SOLN
INTRAMUSCULAR | Status: AC
Start: 2014-11-18 — End: 2014-11-18
  Administered 2014-11-18: 6.25 mg
  Filled 2014-11-18: qty 1

## 2014-11-18 MED ORDER — PROPOFOL 10 MG/ML IV EMUL (WRAP)
INTRAVENOUS | Status: AC
Start: 2014-11-18 — End: ?
  Filled 2014-11-18: qty 20

## 2014-11-18 MED ORDER — PREGABALIN 75 MG PO CAPS
75.0000 mg | ORAL_CAPSULE | ORAL | Status: DC
Start: 2014-11-18 — End: 2014-11-18
  Filled 2014-11-18: qty 1

## 2014-11-18 MED ORDER — GLYCOPYRROLATE 0.2 MG/ML IJ SOLN
INTRAMUSCULAR | Status: DC | PRN
Start: 2014-11-18 — End: 2014-11-18
  Administered 2014-11-18: 0.1 mg via INTRAVENOUS
  Administered 2014-11-18: .7 mg via INTRAVENOUS

## 2014-11-18 MED ORDER — LACTATED RINGERS IV SOLN
INTRAVENOUS | Status: DC
Start: 2014-11-18 — End: 2014-11-18

## 2014-11-18 MED ORDER — MICROFIBRILLAR COLL HEMOSTAT EX POWD
CUTANEOUS | Status: DC | PRN
Start: 2014-11-18 — End: 2014-11-18
  Administered 2014-11-18: 1 g via TOPICAL

## 2014-11-18 MED ORDER — MIDAZOLAM HCL 2 MG/2ML IJ SOLN
INTRAMUSCULAR | Status: AC
Start: 2014-11-18 — End: ?
  Filled 2014-11-18: qty 2

## 2014-11-18 MED ORDER — SODIUM CHLORIDE 0.9 % IR SOLN
Status: DC | PRN
Start: 2014-11-18 — End: 2014-11-18
  Administered 2014-11-18: 1000 mL

## 2014-11-18 MED ORDER — VANCOMYCIN 1000 MG IN 250 ML NS IVPB VIAL-MATE (CNR)
INTRAVENOUS | Status: AC
Start: 2014-11-18 — End: ?
  Filled 2014-11-18: qty 250

## 2014-11-18 MED ORDER — ONDANSETRON HCL 4 MG/2ML IJ SOLN
4.0000 mg | Freq: Once | INTRAMUSCULAR | Status: AC | PRN
Start: 2014-11-18 — End: 2014-11-18

## 2014-11-18 MED ORDER — FENTANYL CITRATE (PF) 50 MCG/ML IJ SOLN (WRAP)
INTRAMUSCULAR | Status: AC
Start: 2014-11-18 — End: ?
  Filled 2014-11-18: qty 2

## 2014-11-18 MED ORDER — ACETAMINOPHEN 325 MG PO TABS
650.0000 mg | ORAL_TABLET | ORAL | Status: DC
Start: 2014-11-18 — End: 2014-11-18
  Administered 2014-11-18: 650 mg via ORAL
  Filled 2014-11-18: qty 2

## 2014-11-18 MED ORDER — SODIUM CHLORIDE 0.9 % IR SOLN
Status: DC | PRN
Start: 2014-11-18 — End: 2014-11-18
  Administered 2014-11-18: 250 mL

## 2014-11-18 MED ORDER — FAMOTIDINE 10 MG/ML IV SOLN (WRAP)
INTRAVENOUS | Status: DC | PRN
Start: 2014-11-18 — End: 2014-11-18
  Administered 2014-11-18: 20 mg via INTRAVENOUS

## 2014-11-18 MED ORDER — CELECOXIB 200 MG PO CAPS
ORAL_CAPSULE | ORAL | Status: AC
Start: 2014-11-18 — End: ?
  Filled 2014-11-18: qty 1

## 2014-11-18 MED ORDER — ONDANSETRON HCL 4 MG/2ML IJ SOLN
INTRAMUSCULAR | Status: AC
Start: 2014-11-18 — End: 2014-11-18
  Administered 2014-11-18: 4 mg via INTRAVENOUS
  Filled 2014-11-18: qty 2

## 2014-11-18 MED ORDER — DEXAMETHASONE SODIUM PHOSPHATE 4 MG/ML IJ SOLN (WRAP)
INTRAMUSCULAR | Status: DC | PRN
Start: 2014-11-18 — End: 2014-11-18
  Administered 2014-11-18: 4 mg via INTRAVENOUS

## 2014-11-18 MED ORDER — ONDANSETRON HCL 4 MG/2ML IJ SOLN
INTRAMUSCULAR | Status: DC | PRN
Start: 2014-11-18 — End: 2014-11-18
  Administered 2014-11-18: 4 mg via INTRAVENOUS

## 2014-11-18 MED ORDER — FENTANYL CITRATE (PF) 50 MCG/ML IJ SOLN (WRAP)
INTRAMUSCULAR | Status: AC
Start: 2014-11-18 — End: 2014-11-18
  Administered 2014-11-18: 25 ug via INTRAVENOUS
  Filled 2014-11-18: qty 2

## 2014-11-18 MED ORDER — ROCURONIUM BROMIDE 50 MG/5ML IV SOLN
INTRAVENOUS | Status: AC
Start: 2014-11-18 — End: ?
  Filled 2014-11-18: qty 5

## 2014-11-18 MED ORDER — PHENYLEPHRINE HCL 10 MG/ML IV SOLN (WRAP)
Status: DC | PRN
Start: 2014-11-18 — End: 2014-11-18
  Administered 2014-11-18 (×3): 100 ug via INTRAVENOUS

## 2014-11-18 MED ORDER — OXYCODONE HCL ER 10 MG PO T12A
EXTENDED_RELEASE_TABLET | ORAL | Status: AC
Start: 2014-11-18 — End: ?
  Filled 2014-11-18: qty 1

## 2014-11-18 MED ORDER — HYDROMORPHONE HCL 1 MG/ML IJ SOLN
0.5000 mg | INTRAMUSCULAR | Status: DC | PRN
Start: 2014-11-18 — End: 2014-11-18

## 2014-11-18 MED ORDER — EPHEDRINE SULFATE 50 MG/ML IJ SOLN
INTRAMUSCULAR | Status: DC | PRN
Start: 2014-11-18 — End: 2014-11-18
  Administered 2014-11-18: 10 mg via INTRAVENOUS
  Administered 2014-11-18: 5 mg via INTRAVENOUS
  Administered 2014-11-18: 10 mg via INTRAVENOUS

## 2014-11-18 MED ORDER — MIDAZOLAM HCL 2 MG/2ML IJ SOLN
INTRAMUSCULAR | Status: DC | PRN
Start: 2014-11-18 — End: 2014-11-18
  Administered 2014-11-18: 2 mg via INTRAVENOUS

## 2014-11-18 MED ORDER — OXYCODONE-ACETAMINOPHEN 5-325 MG PO TABS
1.0000 | ORAL_TABLET | ORAL | 0 refills | Status: DC | PRN
Start: 2014-11-18 — End: 2015-06-02
  Filled 2014-11-18: qty 40, 7d supply, fill #0

## 2014-11-18 MED ORDER — FENTANYL CITRATE (PF) 50 MCG/ML IJ SOLN (WRAP)
INTRAMUSCULAR | Status: DC | PRN
Start: 2014-11-18 — End: 2014-11-18
  Administered 2014-11-18: 50 ug via INTRAVENOUS
  Administered 2014-11-18 (×2): 25 ug via INTRAVENOUS
  Administered 2014-11-18: 100 ug via INTRAVENOUS

## 2014-11-18 MED ORDER — EPHEDRINE SULFATE 50 MG/ML IJ SOLN
INTRAMUSCULAR | Status: AC
Start: 2014-11-18 — End: ?
  Filled 2014-11-18: qty 1

## 2014-11-18 MED ORDER — GELATIN ABSORBABLE 100 EX MISC
CUTANEOUS | Status: DC | PRN
Start: 2014-11-18 — End: 2014-11-18
  Administered 2014-11-18: 1 via TOPICAL

## 2014-11-18 MED ORDER — DIPHENHYDRAMINE HCL 50 MG/ML IJ SOLN
12.5000 mg | Freq: Once | INTRAMUSCULAR | Status: DC | PRN
Start: 2014-11-18 — End: 2014-11-18

## 2014-11-18 MED ORDER — OXYCODONE-ACETAMINOPHEN 5-325 MG PO TABS
1.0000 | ORAL_TABLET | Freq: Once | ORAL | Status: DC | PRN
Start: 2014-11-18 — End: 2014-11-18

## 2014-11-18 MED ORDER — FENTANYL CITRATE (PF) 50 MCG/ML IJ SOLN (WRAP)
25.0000 ug | INTRAMUSCULAR | Status: DC | PRN
Start: 2014-11-18 — End: 2014-11-18
  Administered 2014-11-18: 25 ug via INTRAVENOUS

## 2014-11-18 MED ORDER — ACETAMINOPHEN 325 MG PO TABS
ORAL_TABLET | ORAL | Status: AC
Start: 2014-11-18 — End: ?
  Filled 2014-11-18: qty 2

## 2014-11-18 MED ORDER — GLYCOPYRROLATE 0.2 MG/ML IJ SOLN
INTRAMUSCULAR | Status: AC
Start: 2014-11-18 — End: ?
  Filled 2014-11-18: qty 1

## 2014-11-18 MED ORDER — LIDOCAINE HCL (PF) 2 % IJ SOLN
INTRAMUSCULAR | Status: AC
Start: 2014-11-18 — End: ?
  Filled 2014-11-18: qty 5

## 2014-11-18 MED ORDER — OXYCODONE HCL ER 10 MG PO T12A
10.0000 mg | EXTENDED_RELEASE_TABLET | ORAL | Status: DC
Start: 2014-11-18 — End: 2014-11-18
  Administered 2014-11-18: 10 mg via ORAL
  Filled 2014-11-18: qty 1

## 2014-11-18 MED ORDER — EPINEPHRINE HCL 1 MG/ML IJ SOLN (WRAP)
Status: DC | PRN
Start: 2014-11-18 — End: 2014-11-18
  Administered 2014-11-18: 15:00:00 1 mg

## 2014-11-18 MED ORDER — MEPERIDINE HCL 25 MG/ML IJ SOLN
12.5000 mg | Freq: Once | INTRAMUSCULAR | Status: DC | PRN
Start: 2014-11-18 — End: 2014-11-18

## 2014-11-18 MED ORDER — OXYCODONE HCL 5 MG PO TABS
ORAL_TABLET | ORAL | Status: AC
Start: 2014-11-18 — End: 2014-11-18
  Administered 2014-11-18: 5 mg via ORAL
  Filled 2014-11-18: qty 1

## 2014-11-18 MED ORDER — OXYCODONE HCL 5 MG PO TABS
5.0000 mg | ORAL_TABLET | Freq: Once | ORAL | Status: AC
Start: 2014-11-18 — End: 2014-11-18

## 2014-11-18 MED ORDER — ROCURONIUM BROMIDE 50 MG/5ML IV SOLN
INTRAVENOUS | Status: DC | PRN
Start: 2014-11-18 — End: 2014-11-18
  Administered 2014-11-18: 40 mg via INTRAVENOUS

## 2014-11-18 MED ORDER — PREGABALIN 75 MG PO CAPS
ORAL_CAPSULE | ORAL | Status: AC
Start: 2014-11-18 — End: ?
  Filled 2014-11-18: qty 1

## 2014-11-18 MED ORDER — PHENYLEPHRINE 100 MCG/ML IN NACL 0.9% IV SOSY
PREFILLED_SYRINGE | INTRAVENOUS | Status: DC | PRN
Start: 2014-11-18 — End: 2014-11-18
  Administered 2014-11-18 (×2): 100 ug via INTRAVENOUS
  Administered 2014-11-18: 150 ug via INTRAVENOUS

## 2014-11-18 MED ORDER — VANCOMYCIN HCL 1000 MG IV SOLR
1500.0000 mg | INTRAVENOUS | Status: DC
Start: 2014-11-18 — End: 2014-11-18
  Administered 2014-11-18 (×2): 1500 mg via INTRAVENOUS
  Filled 2014-11-18: qty 1500

## 2014-11-18 MED ORDER — PROPOFOL 10 MG/ML IV EMUL (WRAP)
INTRAVENOUS | Status: DC | PRN
Start: 2014-11-18 — End: 2014-11-18
  Administered 2014-11-18: 200 mg via INTRAVENOUS

## 2014-11-18 MED ORDER — NEOSTIGMINE METHYLSULFATE 1 MG/ML IJ SOLN
INTRAMUSCULAR | Status: DC | PRN
Start: 2014-11-18 — End: 2014-11-18
  Administered 2014-11-18: 4 mg via INTRAVENOUS

## 2014-11-18 MED ORDER — LIDOCAINE HCL 2 % IJ SOLN
INTRAMUSCULAR | Status: DC | PRN
Start: 2014-11-18 — End: 2014-11-18
  Administered 2014-11-18: 100 mg

## 2014-11-18 MED ORDER — BUPIVACAINE-EPINEPHRINE (PF) 0.5% -1:200000 IJ SOLN
INTRAMUSCULAR | Status: DC | PRN
Start: 2014-11-18 — End: 2014-11-18
  Administered 2014-11-18: 20 mL

## 2014-11-18 MED ORDER — SODIUM CHLORIDE BACTERIOSTATIC 0.9 % IJ SOLN
INTRAMUSCULAR | Status: DC | PRN
Start: 2014-11-18 — End: 2014-11-18
  Administered 2014-11-18: 10 mL

## 2014-11-18 MED ORDER — PROMETHAZINE HCL 25 MG/ML IJ SOLN
6.2500 mg | Freq: Once | INTRAMUSCULAR | Status: DC
Start: 2014-11-18 — End: 2014-11-18

## 2014-11-18 MED ORDER — THROMBIN 5000 UNITS EX SOLR
CUTANEOUS | Status: DC | PRN
Start: 2014-11-18 — End: 2014-11-18
  Administered 2014-11-18: 15000 [IU] via TOPICAL

## 2014-11-18 MED ORDER — CELECOXIB 200 MG PO CAPS
200.0000 mg | ORAL_CAPSULE | ORAL | Status: DC
Start: 2014-11-18 — End: 2014-11-18
  Administered 2014-11-18: 200 mg via ORAL
  Filled 2014-11-18: qty 1

## 2014-11-18 SURGICAL SUPPLY — 87 items
ANGIO CATH CODM (Procedure Accessories) ×2 IMPLANT
BAG DECANTER STERILE (Procedure Accessories) ×2 IMPLANT
BANDAGE ADH PLSTR POLYACRYLATE CVRL 2YD (Bandage) ×1
BANDAGE ADHESIVE L2 YD X W6 IN STRETCH (Bandage) ×1 IMPLANT
BANDAGE ADHESIVE L2 YD X W6 IN STRETCH NONWOVEN POROUS COVER-ROLL (Bandage) ×1 IMPLANT
BNDG CVRL ADH 2YDX6IN PLSTR POLYACRYLATE (Bandage) ×1
CATH IV 14GX1.75IN NLTX (IV Supply) ×2 IMPLANT
CLEANER ELECTROSURGICAL TIP PENCIL (Procedure Accessories) ×1 IMPLANT
CLEANER ELECTROSURGICAL TIP PENCIL HANDSWITCH LECTROBRASIVE (Procedure Accessories) ×1 IMPLANT
CLEANER ESURG TIP PNCL LCTRBRS STRL (Procedure Accessories) ×2
CLOSURE STERI-STRIP 1X5IN (Dressing) ×2 IMPLANT
COTTONOID 1/2X1" 80-1402" (Dressing) IMPLANT
COVER HNDL LIGHT SINGLES (Procedure Accessories) ×4 IMPLANT
COVER HVYDTY BLK 65X90IN (Drape) ×2 IMPLANT
COVER MAYO CNVRT STND 23IN PLS REINF (Drape) ×1
COVER STAND W23 IN REINFORCE PLASTIC (Drape) ×1 IMPLANT
COVER STND PLS MAYO CNVRT 23IN LF STRL (Drape) ×1 IMPLANT
CTTND 1/2X1/2" (Sponge) IMPLANT
DRAIN INCS SIL FULL FLUT FLT 3/8IN LF (Drain) ×2
DRAIN RADIOPAQUE 4 FREE FLOW CHANNEL (Drain) ×1 IMPLANT
DRAIN RADIOPAQUE 4 FREE FLOW CHANNEL BARD L3/8 IN INCISION SILICONE (Drain) ×1 IMPLANT
DRAPE 3/4 SHEET FANFLD 52X76IN (Drape) ×2 IMPLANT
DRAPE LAPAROTOMY (Drape) ×2 IMPLANT
DRAPE MAG FM DVN 20X16IN LF STRL HNDFR (Drape) ×2
DRAPE MAGNETIC HAND FREE TRANSFER (Drape) ×1 IMPLANT
DRESSING FLEXZAN 4X4 (Dressing) ×1 IMPLANT
DRESSING TRANSPARENT L4 3/4 IN X W4 IN (Dressing) ×1 IMPLANT
DRESSING TRANSPARENT L4 3/4 IN X W4 IN POLYURETHANE ADHESIVE (Dressing) IMPLANT
DRESSING TRNS PU STD TGDRM 4.75X4IN LF (Dressing) ×2
EVACUATOR RELIAVAC 100M (Drain) ×2 IMPLANT
FORCEPS ELECTROSURGICAL L7 3/4 IN 1.5 MM BAYONET BIPOLAR INSULATE CORD (Disposable Instruments) ×1 IMPLANT
FORCEPS ESURG SS 1.5MM BYNT LBRTY S-G (Disposable Instruments) ×2 IMPLANT
GLOVE SRG NTR RBR 8 INDCTR BGL 299X103MM (Glove) ×4
GLOVE SURG BIOGEL ORTHO SZ8 (Glove) ×4 IMPLANT
GLOVE SURGICAL 8 INDICATOR BIOGEL POWDER (Glove) ×2 IMPLANT
GLOVE SURGICAL 8 INDICATOR BIOGEL POWDER FREE SMOOTH BEAD CUFF (Glove) ×2 IMPLANT
GOWN OPTIMA STRL BACK OR (Gown) ×2 IMPLANT
MARKER SKIN 2IN1 W/T-O SLEEVE (Procedure Accessories) ×2 IMPLANT
NEEDLE NOKOR FILTER 19GX1.5IN (Needles) ×2 IMPLANT
NEEDLE SPINAL BD OD22 GA L3 1/2 IN (Needles) ×1 IMPLANT
NEEDLE SPINAL L3 1/2 IN REGULAR WALL QUINCKE TIP OD22 GA BD (Needles) ×1 IMPLANT
NEEDLE SPNL PP RW BD QNCK 22GA 3.5IN LF (Needles) ×2
PACK SPNE FFX (Pack) ×2 IMPLANT
PAD ABD PVC CRTY 9X5IN LF STRL 3 LYR (Dressing) ×2 IMPLANT
PAD ARMBOARD 20X8X2IN (Procedure Accessories) ×6 IMPLANT
PAD ELECTROSRG GRND REM W CRD (Procedure Accessories) ×2 IMPLANT
POSITIONER HEAD SLOTTED ADLT (Positioning Supplies) ×2 IMPLANT
SLEEVE TED SCD LARGE (Procedure Accessories) ×2 IMPLANT
SOLUTION IRR 0.9% NACL 1000ML LF STRL (Irrigation Solutions) ×2
SOLUTION IRRIGATION 0.9% SODIUM CHLORIDE (Irrigation Solutions) ×1 IMPLANT
SOLUTION IRRIGATION 0.9% SODIUM CHLORIDE 1000 ML PLASTIC POUR BOTTLE (Irrigation Solutions) ×1 IMPLANT
SOLUTION SRGPRP 74% ISPRP 0.7% IOD (Prep) ×2
SOLUTION SURGICAL PREP 26 ML DURAPREP (Prep) ×1 IMPLANT
SOLUTION SURGICAL PREP 26 ML DURAPREP 74% ISOPROPYL ALCOHOL 0.7% (Prep) ×1 IMPLANT
SPONGE GAUZE 12PLY STRL 4X4IN (Sponge) ×2 IMPLANT
SPONGE LAP 18X18IN PREWASH WHT (Sponge) ×2
SPONGE LAPAROTOMY L18 IN X W18 IN (Sponge) ×1 IMPLANT
SPONGE LAPAROTOMY L18 IN X W18 IN PREWASH WHITE (Sponge) ×1 IMPLANT
SPONGE PEANUT C5 HOLDER DISSECTOR XRAY (Sponge) ×1 IMPLANT
SPONGE PEANUT C5 HOLDER DISSECTOR XRAY DETECTABLE OD3/8 IN DUKAL (Sponge) ×1 IMPLANT
SPONGE PEANUT RADPQE STRL3/8IN (Sponge) ×2
SPONGE SRG CTTND CDMN SUTUREWELD 3X1IN (Sponge) ×2
SPONGE SRG VISTEC 8X4IN LF STRL 12 PLY (Sponge) ×2
SPONGE SURGICAL L3 IN X W1 IN ABSORBENT (Sponge) ×1 IMPLANT
SPONGE SURGICAL L3 IN X W1 IN ABSORBENT PRECISION CUT RADIOPAQUE (Sponge) ×1 IMPLANT
SPONGE SURGICAL L8 IN X W4 IN 12 PLY (Sponge) ×1 IMPLANT
SPONGE SURGICAL L8 IN X W4 IN 12 PLY RADIOPAQUE BAND VISTEC BLUE WHITE (Sponge) ×1 IMPLANT
STRIP SKIN CLOSURE L4 IN X W1/2 IN (Dressing) ×1 IMPLANT
STRIP SKIN CLOSURE L4 IN X W1/2 IN REINFORCE STERI-STRIP POLYESTER (Dressing) IMPLANT
STRIP SKNCLS PLSTR STRSTRP 4X.5IN LF (Dressing) ×2
SUTURE ABS 1 CT1 VCL 18IN CR BRD 8 STRN (Suture) ×4
SUTURE ABS 2-0 CT1 VCL 18IN CR BRD 8 (Suture) ×4
SUTURE ABS 3-0 PS1 VCL MTPS 27IN BRD (Suture) ×2
SUTURE COATED VICRYL 1 CT-1 L18 IN (Suture) ×2 IMPLANT
SUTURE COATED VICRYL 2-0 CT-1 18IN CNTRL BRD 8 STRND UNDYED (Suture) ×2 IMPLANT
SUTURE COATED VICRYL 2-0 CT-1 L18 IN (Suture) ×2 IMPLANT
SUTURE COATED VICRYL 3-0 PS-1 L27 IN (Suture) ×1 IMPLANT
SYRINGE LUER LOCK 10CC (Syringes, Needles) ×8 IMPLANT
SYSTEM IRR MTL VTVU YNKR 12FR LF STRL (Suction) ×2 IMPLANT
SYSTEM IRRIGATION EXTEND SUCTION ILLUMINATION TUBE BULB TIP VITAL-VUE (Suction) ×1 IMPLANT
TOOL DISSECTING L14 CM MATCH HEAD FLUTE (Burr) ×1 IMPLANT
TOOL DISSECTING L14 CM MATCH HEAD FLUTE OD3 MM MIDAS REX LEGEND (Burr) ×1 IMPLANT
TOOL DSCT LGND 3MM 14MM (Burr) ×2
TOWEL L26 IN X W17 IN COTTON PREWASH DELINT BLUE ACTISORB DELUXE (Procedure Accessories) ×2 IMPLANT
TOWEL SRG CTTN 26X17IN LF STRL PREWASH (Procedure Accessories) ×4 IMPLANT
TOWEL STERILE REUSABLE 8PK (Procedure Accessories) ×2 IMPLANT
TUBING SUCTION 3/16X10 (Tubing) ×4 IMPLANT

## 2014-11-18 NOTE — Transfer of Care (Signed)
Anesthesia Transfer of Care Note    Patient: Jesus Bowman    Procedures performed: Procedure(s) with comments:  REMOVAL, POSTERIOR LUMBAR SPINE HARDWARE - L4-L5 REMOVAL OF HARDWARE    Anesthesia type: General ETT    Patient location:Phase I PACU    Last vitals:   Filed Vitals:    11/18/14 1639   BP: 125/59   Pulse: 78   Temp: 36.3 C (97.3 F)   Resp: 16   SpO2: 97%       Post pain: Patient not complaining of pain, continue current therapy      Mental Status:drowsy    Respiratory Function: tolerating face mask    Cardiovascular: stable    Nausea/Vomiting: patient not complaining of nausea or vomiting    Hydration Status: adequate    Post assessment: no apparent anesthetic complications, no reportable events and no evidence of recall. Pt transported to PACU on 8L O2 via SFM, report to RN.

## 2014-11-18 NOTE — H&P (Signed)
History and physical reviewed and patient re-examined today.  No changes noted.  Pain level ranges from 4-8/10

## 2014-11-18 NOTE — Anesthesia Preprocedure Evaluation (Signed)
Anesthesia Evaluation    AIRWAY    Mallampati: III    TM distance: <3 FB  Neck ROM: full  Mouth Opening:full   CARDIOVASCULAR    regular and normal       DENTAL    no notable dental hx     PULMONARY    clear to auscultation     OTHER FINDINGS              PSS Anesthesia Comments:   HTN  GERD  Spinal stenosis  Kidney stones        Anesthesia Plan    ASA 3     general                                 informed consent obtained

## 2014-11-19 ENCOUNTER — Encounter: Payer: Self-pay | Admitting: Orthopaedic Surgery

## 2014-11-19 NOTE — Anesthesia Postprocedure Evaluation (Signed)
Anesthesia Post Evaluation    Patient: Jesus Bowman    Procedure(s) with comments:  REMOVAL, POSTERIOR LUMBAR SPINE HARDWARE - L4-L5 REMOVAL OF HARDWARE    Anesthesia type: general    Last Vitals:   Filed Vitals:    11/18/14 2030   BP: 157/65   Pulse: 84   Temp: 36.7 C (98 F)   Resp: 16   SpO2:        Patient Location: Phase I PACU      Post Pain: Patient not complaining of pain, continue current therapy    Mental Status: awake and alert    Respiratory Function: tolerating room air    Cardiovascular: stable    Nausea/Vomiting: patient not complaining of nausea or vomiting    Hydration Status: adequate    Post Assessment: no apparent anesthetic complications          Anesthesia Qualified Clinical Data Registry    Central Line      CVC insertion : NO                                               Perioperative temperature management      General/neuraxial anesthesia > or = 60 minutes (excluding CABG) : YES              > Use of intraoperative active warming : YES              > Temperature > or = 36 degrees Centigrade (96.8 degrees Farenheit) during time span from 30 minutes before up to 15 minutes after anesthesia end time : YES      Administration of antibiotic prophylaxis      Age > or = 18, with IV access, with surgical procedure for which antibiotic prophylaxis indicated, and not on chronic antibiotics : YES              > Prophylactic antibiotics within 1 hour of incision (or fluroroquinolone/vancomycin within 2 hours of incision) : YES    Medication Administration      Ordering or administration of drug inconsistent with intended drug, dose, delivery or timing : NO      Dental loss/damage      Dental injury with administration of anesthesia : NO      Difficult intubation due to unrecognized difficult airway        Elective airway procedure including but not limited to: tracheostomy, fiberoptic bronchoscopy, rigid bronchoscopy; jet ventilation; or elective use of a device to facilitate airway management  such as a Glidescope : NO                > Unanticipated difficult intubation post pre-evaluation : NO      Aspiration of gastric contents        Aspiration of gastric contents : NO                    Surgical fire        Procedure requiring electrocautery/laser : YES                > Ignition/burning in invasive procedure location : NO      Immediate perioperative cardiac arrest        Cardiac arrest in OR or PACU : NO  Unplanned hospital admission        Unplanned hospital admission for initially intended outpatient anesthesia service : NO      Unplanned ICU admission        Unplanned ICU admission related to anesthesia occurring within 24 hours of induction or start of MAC : NO      Surgical case cancellation        Cancellation of procedure after care already started by anesthesia care team : NO      Post-anesthesia transfer of care checklist/protocol to PACU        Transfer from OR to PACU upon case conclusion : YES              > Use of PACU transfer checklist/protocol : YES     (Includes the key elements of: patient identification, responsible practitioner identification (PACU nurse or advanced practitioner), discussion of pertinent history and procedure course, intraoperative anesthetic management, post-procedure plans, acknowledgement/questions)    Post-anesthesia transfer of care checklist/protocol to ICU        Transfer from OR to ICU upon case conclusion : NO                    Post-operative nausea/vomiting risk protocol        Post-operative nausea/vomiting risk protocol : YES  Patient > or = 18 with care initiated by anesthesia team that has a risk factor screen for post-op nausea/vomiting (Includes male, hx PONV, or motion sickness, non-smoker, intended opioid administration for post-op analgesia.)    Anaphylaxis        Anaphylaxis during anesthesia services : NO    (Inclusive of any suspected transfusion reaction in association with blood-bank confirmed blood product  incompatibility)              Duard Larsen, 11/19/2014 2:23 PM

## 2014-11-19 NOTE — Op Note (Signed)
Procedure Date: 11/18/2014     Patient Type: I     SURGEON: Clelia Schaumann MD  ASSISTANT:       FIRST ASSISTANT:   Vivien Rota, SA     SECOND ASSISTANT:   Ree Edman, PA     PREOPERATIVE DIAGNOSES:  1.  Retained hardware, L4-L5, with broken pedicle screws.  2.  Chronic lower back pain.     POSTOPERATIVE DIAGNOSES:  1.  Retained hardware, L4-L5, with broken pedicle screws.  2.  Chronic lower back pain.     TITLE OF PROCEDURE:  Removal of hardware, L4-L5.       ANESTHESIA:  General.     COMPLICATIONS:  None.     DRAINS:  None.     INDICATIONS FOR PROCEDURE:  The patient is a 59 year old gentleman who has previously undergone  laminectomy and fusion at L4-L5.  In the course of his healing, he was  noted following a traumatic event to have broken pedicle screws.  He  presents now for hardware removal.  Preoperatively, he was counseled  regarding the indications for procedure and potential complications.   Additionally, the patient is aware of the potential need for additional  reconstructive surgery in the future.     DESCRIPTION OF PROCEDURE:  The patient received prophylactic antibiotics in the holding area.  He was  brought to the operating room.  After the induction of anesthesia,  sequential compression devices were applied to both legs.  He was then  carefully placed in the prone position on Wilson frame.  Care was taken to  ensure his abdomen was free of compression and all bony prominences were  well padded.  The back was prepped and draped in the usual fashion.  The  patient's prior incision was utilized and dissection was carried sharply  through the skin and subcutaneous tissue.  The fascia was then split  bilaterally over the hardware.  The hardware was quite deep in this patient  due to body habitus, but after the fascia was split, the hardware was  palpated and the paraspinal musculature was further mobilized to expose the  hardware bilaterally.  This was the [employer] system.  The unlocker was used  to  release the rod from the screw heads.  The rod was then removed.  The  broken screw at L4 was removed.  This was all done on the right-hand first.   Then, the screw at L5 was removed.  The screw at L5 was solidly in place  with no evidence of loosening.  The pedicle screw entry sites were packed  with bone wax for good hemostasis.  This was then repeated on the left side  in the exact same fashion.  AP fluoroscopy revealed removal of all hardware  except for the broken portion of the pedicle screws in the pedicles of L4.   The wound was copiously irrigated.  The fascia was repaired with #1 Vicryl.   The subcutaneous tissue was repaired with 2-0 Vicryl.  Skin was closed in  a subcuticular fashion.  Incision was injected with a solution of 0.5%  Marcaine with epinephrine after which a sterile dressing was applied.  The  patient was subsequently transferred to the recovery room without incident.           D:  11/18/2014 22:16 PM by Dr. Edman Circle. Marjo Bicker, MD [phone])  T:  11/19/2014 09:10 AM by       Everlean Cherry: [id number]) (Doc ID: [id number])

## 2014-11-22 NOTE — UM Notes (Signed)
11/18/14 1115  Admit to Inpatient        Admitting Physician CHILDS, RONALD C      Diagnosis Painful orthopaedic hardware     Estimated Length of Stay > or = to 2 midnights     Tentative Discharge Plan? Home or Self Care     Patient Class Inpatient     Update Service Spine      PREOPERATIVE DIAGNOSES:  1. Retained hardware, L4-L5, with broken pedicle screws.  2. Chronic lower back pain.    POSTOPERATIVE DIAGNOSES:  1. Retained hardware, L4-L5, with broken pedicle screws.  2. Chronic lower back pain.    TITLE OF PROCEDURE:  Removal of hardware, L4-L5.     143/77, p 64, t 98.6, rr 16, sats 96%    Vs, ivfs, vancomycin iv x2, phenergan iv x1    Medicare Inpatient only procedure  CPT - [phone]

## 2015-02-18 ENCOUNTER — Other Ambulatory Visit: Payer: Self-pay | Admitting: Orthopaedic Surgery

## 2015-04-01 ENCOUNTER — Encounter (HOSPITAL_COMMUNITY)
Admission: EM | Disposition: A | Discharge status: Home / Self Care | Payer: Self-pay | Source: Home / Self Care | Attending: Emergency Medicine

## 2015-04-01 ENCOUNTER — Encounter (HOSPITAL_COMMUNITY): Payer: Self-pay | Admitting: Emergency Medicine

## 2015-04-01 ENCOUNTER — Emergency Department (HOSPITAL_COMMUNITY): Payer: Medicare Other

## 2015-04-01 ENCOUNTER — Observation Stay (HOSPITAL_COMMUNITY)
Admission: EM | Admit: 2015-04-01 | Discharge: 2015-04-03 | Disposition: A | Discharge status: Home / Self Care | Payer: Medicare Other | Attending: Emergency Medicine | Admitting: Emergency Medicine

## 2015-04-01 DIAGNOSIS — F17210 Nicotine dependence, cigarettes, uncomplicated: Secondary | ICD-10-CM | POA: Insufficient documentation

## 2015-04-01 DIAGNOSIS — G5600 Carpal tunnel syndrome, unspecified upper limb: Secondary | ICD-10-CM | POA: Insufficient documentation

## 2015-04-01 DIAGNOSIS — R0602 Shortness of breath: Secondary | ICD-10-CM | POA: Diagnosis not present

## 2015-04-01 DIAGNOSIS — R0989 Other specified symptoms and signs involving the circulatory and respiratory systems: Secondary | ICD-10-CM | POA: Diagnosis not present

## 2015-04-01 DIAGNOSIS — I10 Essential (primary) hypertension: Secondary | ICD-10-CM | POA: Insufficient documentation

## 2015-04-01 DIAGNOSIS — Z79899 Other long term (current) drug therapy: Secondary | ICD-10-CM | POA: Diagnosis not present

## 2015-04-01 DIAGNOSIS — I959 Hypotension, unspecified: Secondary | ICD-10-CM | POA: Diagnosis present

## 2015-04-01 DIAGNOSIS — Z8679 Personal history of other diseases of the circulatory system: Secondary | ICD-10-CM

## 2015-04-01 DIAGNOSIS — E785 Hyperlipidemia, unspecified: Secondary | ICD-10-CM | POA: Diagnosis not present

## 2015-04-01 DIAGNOSIS — R110 Nausea: Secondary | ICD-10-CM | POA: Diagnosis not present

## 2015-04-01 DIAGNOSIS — R55 Syncope and collapse: Secondary | ICD-10-CM | POA: Diagnosis present

## 2015-04-01 HISTORY — DX: Hyperlipidemia, unspecified: E78.5

## 2015-04-01 HISTORY — DX: Carpal tunnel syndrome, unspecified upper limb: G56.00

## 2015-04-01 HISTORY — DX: Essential (primary) hypertension: I10

## 2015-04-01 LAB — CBC WITH DIFFERENTIAL/PLATELET
Basophils Absolute: 0 10*3/uL (ref 0.0–0.1)
Basophils Relative: 0 %
EOS ABS: 0.1 10*3/uL (ref 0.0–0.7)
Eosinophils Relative: 1 %
HCT: 41.1 % (ref 39.0–52.0)
HEMOGLOBIN: 12.9 g/dL — AB (ref 13.0–17.0)
LYMPHS ABS: 2.7 10*3/uL (ref 0.7–4.0)
Lymphocytes Relative: 40 %
MCH: 28.9 pg (ref 26.0–34.0)
MCHC: 31.4 g/dL (ref 30.0–36.0)
MCV: 92.2 fL (ref 78.0–100.0)
Monocytes Absolute: 0.5 10*3/uL (ref 0.1–1.0)
Monocytes Relative: 8 %
NEUTROS PCT: 51 %
Neutro Abs: 3.5 10*3/uL (ref 1.7–7.7)
PLATELETS: 193 10*3/uL (ref 150–400)
RBC: 4.46 MIL/uL (ref 4.22–5.81)
RDW: 13.8 % (ref 11.5–15.5)
WBC: 6.9 10*3/uL (ref 4.0–10.5)

## 2015-04-01 LAB — PROTIME-INR
INR: 0.98 (ref 0.00–1.49)
PROTHROMBIN TIME: 13.2 s (ref 11.6–15.2)

## 2015-04-01 LAB — I-STAT TROPONIN, ED: TROPONIN I, POC: 0 ng/mL (ref 0.00–0.08)

## 2015-04-01 LAB — APTT: APTT: 24 s (ref 24–37)

## 2015-04-01 SURGERY — LEFT HEART CATH AND CORONARY ANGIOGRAPHY
Anesthesia: LOCAL

## 2015-04-01 MED ORDER — LIDOCAINE HCL (PF) 1 % IJ SOLN
INTRAMUSCULAR | Status: AC
Start: 2015-04-01 — End: 2015-04-01
  Filled 2015-04-01: qty 30

## 2015-04-01 MED ORDER — NITROGLYCERIN 1 MG/10 ML FOR IR/CATH LAB
INTRA_ARTERIAL | Status: AC
Start: 2015-04-01 — End: 2015-04-01
  Filled 2015-04-01: qty 10

## 2015-04-01 MED ORDER — HEPARIN SODIUM (PORCINE) 1000 UNIT/ML IJ SOLN
INTRAMUSCULAR | Status: AC
Start: 2015-04-01 — End: 2015-04-01
  Filled 2015-04-01: qty 1

## 2015-04-01 MED ORDER — HEPARIN (PORCINE) IN NACL 2-0.9 UNIT/ML-% IJ SOLN
INTRAMUSCULAR | Status: AC
  Filled 2015-04-01: qty 1500

## 2015-04-01 MED ORDER — VERAPAMIL HCL 2.5 MG/ML IV SOLN
INTRAVENOUS | Status: AC
  Filled 2015-04-01: qty 2

## 2015-04-01 MED ORDER — SODIUM CHLORIDE 0.9 % IV BOLUS (SEPSIS)
1000.0000 mL | Freq: Once | INTRAVENOUS | Status: AC
Start: 2015-04-02 — End: 2015-04-02
  Administered 2015-04-02: 1000 mL via INTRAVENOUS

## 2015-04-01 NOTE — Progress Notes (Signed)
   04/01/15 2321  Clinical Encounter Type  Visited With Health care provider  Visit Type Initial;Code;ED  Referral From Nurse   Chaplain responded to a code STEMI in the ED. No family is present at this time, and [place] EMS [place] Seems like the STEMI might be cancelled. Chaplain support available as needed.   Alda PonderAdam M Georgetta Crafton, Chaplain 04/01/2015 11:22 PM

## 2015-04-01 NOTE — ED Notes (Signed)
Cooper MD Cardiology at bedside.

## 2015-04-01 NOTE — ED Notes (Signed)
[facility] [place] [facility] with c/o chest pain starting today [place]. Smoked marijuana with dinner, ETOh intake. 70/30 initial BP, gray in color, no radial pulses upon EMS arrival. EMS EKG shows ST elevation in V2-V6. Two episodes of vomiting. BP up to 80s systolic in [place].Given 4 MG zofran IV, 324 MG aspirin that he immediately threw up. No nitro d/t BP. Denies CP at this time.

## 2015-04-01 NOTE — Consult Note (Signed)
INTERVENTIONAL CARDIOLOGY CONSULT NOTE  Patient ID: Austin Bowers, MRN: [id number], DOB/AGE: 09-29-1955 60 y.o. Admit date: 04/01/2015 Date of Consult: 04/01/2015  Primary Physician: No primary care provider on file. Primary Cardiologist: none Referring Physician: Dr Bebe ShaggyWickline  Chief Complaint: nausea Reason for Consultation: STEMI  HPI: 60 year old gentleman brought in by EMS to the emergency department. The patient had nausea and shortness of breath and a friend called EMS. The patient is traveling through town from his home in [place]. He has been smoking marijuana and drinking alcohol. Initially the patient was hypotensive and in route he had multiple episodes of vomiting. An EKG from the field demonstrated mild diffuse ST segment elevation and a code STEMI was called.  At the time of my evaluation in the emergency room, the patient denies chest pain or chest pressure. He denies shortness of breath at present. He states "I feel very intoxicated." He does not have any other specific complaints at this time. His blood pressure is improved after receiving an IV fluid bolus. His follow-up EKG shows minimal diffuse ST elevation without reciprocal change, not diagnostic of STEMI.  Cardiac risk factors include hypertension and hyperlipidemia. The patient denies diabetes or family history of coronary artery disease. He is a smoker.  Medical History:  Past Medical History  Diagnosis Date  . Hypertension   . Hyperlipidemia   . Carpal tunnel syndrome       Surgical History:  Past Surgical History  Procedure Laterality Date  . Back surgery    . Partial hip arthroplasty       Home Meds: Prior to Admission medications   Medication Sig Start Date End Date Taking? Authorizing Provider  olmesartan (BENICAR) 20 MG tablet Take 20 mg by mouth daily.   Yes Historical Provider, MD  pregabalin (LYRICA) 100 MG capsule Take 100 mg by mouth 2 (two) times daily.   Yes Historical Provider, MD    rosuvastatin (CRESTOR) 10 MG tablet Take 10 mg by mouth daily.   Yes Historical Provider, MD    Inpatient Medications:     Allergies: No Known Allergies  Social History   Social History  . Marital Status: Single    Spouse Name: N/A  . Number of Children: N/A  . Years of Education: N/A   Occupational History  . Not on file.   Social History Main Topics  . Smoking status: Current Every Day Smoker    Types: Cigarettes  . Smokeless tobacco: Not on file  . Alcohol Use: Yes  . Drug Use: Yes    Special: Marijuana  . Sexual Activity: Not on file   Other Topics Concern  . Not on file   Social History Narrative  . No narrative on file     Family history is negative for premature coronary artery disease  Review of Systems: Positive for hip and back pain, dizziness, and nausea. All other systems reviewed and are otherwise negative except as noted above.  Physical Exam: Blood pressure 105/52, pulse 62, temperature 98.4 F (36.9 C), temperature source Oral, resp. rate 17, height 5\' 7"  (1.702 m), weight 250 lb (113.399 kg), SpO2 97 %. Pt is somnolent but easily arousable, alert and oriented, obese male in no acute distress HEENT: normal Neck: JVP normal. Carotid upstrokes normal without bruits. No thyromegaly. Lungs: equal expansion, clear bilaterally CV: Apex is discrete and nondisplaced, RRR without murmur or gallop Abd: soft, NT, +BS, no bruit, no hepatosplenomegaly Back: no CVA tenderness Ext: no C/C/E  DP/PT pulses intact and = Skin: warm and dry without rash Neuro: CNII-XII intact             Strength intact = bilaterally    Labs: No results for input(s): CKTOTAL, CKMB, TROPONINI in the last 72 hours. No results found for: WBC, HGB, HCT, MCV, PLT No results for input(s): NA, K, CL, CO2, BUN, CREATININE, CALCIUM, PROT, BILITOT, ALKPHOS, ALT, AST, GLUCOSE in the last 168 hours.  Invalid input(s): LABALBU No results found for: CHOL, HDL, LDLCALC, TRIG No  results found for: DDIMER  Radiology/Studies:  No results found.  EKG: Normal sinus rhythm, minimal diffuse ST elevation consider acute injury, pericarditis, or normal variant. Not diagnostic of STEMI area  Cardiac Studies: Initial troponin 0.0  ASSESSMENT AND PLAN:  1. Hypotension - responded to IV fluids  2. Abnormal EKG - consider pericarditis versus ACS. Difficult to sort out in this patient who has been drinking alcohol and smoking marijuana. He does not have chest pain or pressure. His EKG does not meet criteria for STEMI. I think he should be observed with serial enzymes, but would not take him to the Cath Lab unless he develops chest pain or enzymes show clear evidence of myocardial injury.  3. Hypertension - initially hypotensive tonight. Initial evaluation ongoing in the ER.  4. Hyperlipidemia - treated with a statin drug  Signed, Cornell Gaber MD, [facility] 04/01/2015, 11:32 PM

## 2015-04-01 NOTE — ED Provider Notes (Signed)
CSN: [id number]     Arrival date & time 04/01/15  2305 History  By signing my name below, I, Doreatha Martin, attest that this documentation has been prepared under the direction and in the presence of Zadie Rhine, MD. Electronically Signed: Doreatha Martin, ED Scribe. 04/01/2015. 11:28 PM.    Chief Complaint  Patient presents with  . Code STEMI   The history is provided by the patient and the EMS personnel. The history is limited by the condition of the patient. No language interpreter was used.    LEVEL 5 CAVEAT: HPI and ROS limited due to pt condition   HPI Comments: Austin Bowers is a 60 y.o. male brought in by ambulance with h/o HTN, HLD who presents to the Emergency Department complaining of acute onset nausea and SOB at approximately 9:30-10:00 pm. Pt states he drove from [place] to visit a friend, smoked a bowl of marijuana and drank alcohol when he began to feel nauseated and SOB. He denies LOC. He also complains of lightheadedness. He denies any significant CP, but per EMS, his friend stated he was complaining of CP. EMS states his BP was 70/40 on arrival and he had multiple episodes of emesis en route. EKG en route showed ST elevation in V2-V6. Per EMS, pt has been evaluated for CP in the past, but has no h/o MI. No h/o DM. Pt is a current smoker. He denies any other illicit drug use. Pt denies HA, abdominal pain.   Past Medical History  Diagnosis Date  . Hypertension   . Hyperlipidemia   . Carpal tunnel syndrome    Past Surgical History  Procedure Laterality Date  . Back surgery    . Partial hip arthroplasty     No family history on file. Social History  Substance Use Topics  . Smoking status: Current Every Day Smoker    Types: Cigarettes  . Smokeless tobacco: None  . Alcohol Use: Yes    Review of Systems  Unable to perform ROS: Acuity of condition   Allergies  Review of patient's allergies indicates no known allergies.  Home Medications   Prior to Admission medications    Medication Sig Start Date End Date Taking? Authorizing Provider  olmesartan (BENICAR) 20 MG tablet Take 20 mg by mouth daily.   Yes Historical Provider, MD  pregabalin (LYRICA) 100 MG capsule Take 100 mg by mouth 2 (two) times daily.   Yes Historical Provider, MD  rosuvastatin (CRESTOR) 10 MG tablet Take 10 mg by mouth daily.   Yes Historical Provider, MD   BP 105/52 mmHg  Pulse 62  Temp(Src) 98.4 F (36.9 C) (Oral)  Resp 17  Ht  (1.702 m)  Wt 250 lb (113.399 kg)  BMI 39.15 kg/m2  SpO2 97% Physical Exam CONSTITUTIONAL: Ill appearing.  HEAD: Normocephalic/atraumatic EYES: EOMI/PERRL ENMT: Mucous membranes moist NECK: supple no meningeal signs CV: S1/S2 noted, no murmurs/rubs/gallops noted LUNGS: Lungs are clear to auscultation bilaterally, no apparent distress ABDOMEN: soft, nontender, no rebound or guarding, bowel sounds noted throughout abdomen NEURO: Pt is awake/alert/appropriate, moves all extremitiesx4.  No facial droop.   EXTREMITIES: pulses normal/equal, full ROM SKIN: warm, color normal PSYCH: no abnormalities of mood noted, alert and oriented to situation   ED Course  Procedures  DIAGNOSTIC STUDIES: Oxygen Saturation is 97% on RA, normal by my interpretation.    COORDINATION OF CARE: 11:16 PM Discussed treatment plan with pt at bedside which includes lab work, CXR, EKG and pt agreed to plan.  11:18 PM After discussion w/ Dr. Excell Seltzerooper, will cancel code STEMI for now.    Pt improved in the ED He was stable No significant lab abnormalities No change in EKG No CP reported Will admit for observation due to probable near syncopal episode and reported hypotension on the scene D/w dr Toniann Failkakrakandy for admission  Labs Review Labs Reviewed  CBC WITH DIFFERENTIAL/PLATELET - Abnormal; Notable for the following:    Hemoglobin 12.9 (*)    All other components within normal limits  COMPREHENSIVE METABOLIC PANEL - Abnormal; Notable for the following:    Glucose, Bld  125 (*)    Creatinine, Ser 1.57 (*)    GFR calc non Af Amer 46 (*)    GFR calc Af Amer 53 (*)    All other components within normal limits  URINE RAPID DRUG SCREEN, HOSP PERFORMED - Abnormal; Notable for the following:    Tetrahydrocannabinol POSITIVE (*)    All other components within normal limits  CBC WITH DIFFERENTIAL/PLATELET - Abnormal; Notable for the following:    RBC 4.19 (*)    Hemoglobin 12.1 (*)    HCT 38.6 (*)    All other components within normal limits  LIPASE, BLOOD  PROTIME-INR  APTT  ETHANOL  TROPONIN I  TROPONIN I  TROPONIN I  TROPONIN I  CBC  COMPREHENSIVE METABOLIC PANEL  I-STAT TROPOININ, ED    Imaging Review Dg Chest Portable 1 View  04/01/2015  CLINICAL DATA:  Acute onset of generalized chest pain and shortness of breath. Initial encounter. EXAM: PORTABLE CHEST 1 VIEW COMPARISON:  None. FINDINGS: The lungs are mildly hypoexpanded but grossly clear. There is no evidence of focal opacification, pleural effusion or pneumothorax. The cardiomediastinal silhouette is within normal limits. No acute osseous abnormalities are seen. IMPRESSION: Lungs mildly hypoexpanded but grossly clear. Electronically Signed   By: Roanna RaiderJeffery  Chang M.D.   On: 04/01/2015 23:53   I have personally reviewed and evaluated these images and lab results as part of my medical decision-making.   EKG Interpretation   Date/Time:  Friday April 01 2015 23:09:33 EDT Ventricular Rate:  56 PR Interval:  160 QRS Duration: 99 QT Interval:  445 QTC Calculation: 429 R Axis:   26 Text Interpretation:  Sinus rhythm Low voltage, precordial leads ST  elevation suggests acute pericarditis Abnormal ekg No previous ECGs  available Confirmed by Bebe ShaggyWICKLINE  MD, Zanaiya Calabria ([id number]) on 04/01/2015 11:16:16  PM      EKG Interpretation  Date/Time:  Saturday April 02 2015 00:42:20 EDT Ventricular Rate:  55 PR Interval:  162 QRS Duration: 98 QT Interval:  426 QTC Calculation: 407 R Axis:   21 Text  Interpretation:  Sinus rhythm Low voltage, precordial leads Minimal ST elevation, anterior leads No significant change since last tracing Confirmed by Bebe ShaggyWICKLINE  MD, Dorinda HillNALD ([id number]) on 04/02/2015 12:54:28 AM       MDM   Final diagnoses:  Near syncope    Nursing notes including past medical history and social history reviewed and considered in documentation Labs/vital reviewed myself and considered during evaluation xrays/imaging reviewed by myself and considered during evaluation   I personally performed the services described in this documentation, which was scribed in my presence. The recorded information has been reviewed and is accurate.       Zadie Rhineonald Jourdyn Ferrin, MD 04/02/15 [phone]

## 2015-04-01 NOTE — ED Notes (Signed)
No plans to go to cath lab at this time per Dr. Excell Seltzerooper

## 2015-04-02 ENCOUNTER — Encounter (HOSPITAL_COMMUNITY): Payer: Self-pay | Admitting: Internal Medicine

## 2015-04-02 DIAGNOSIS — I959 Hypotension, unspecified: Secondary | ICD-10-CM | POA: Diagnosis not present

## 2015-04-02 DIAGNOSIS — R55 Syncope and collapse: Secondary | ICD-10-CM | POA: Diagnosis present

## 2015-04-02 DIAGNOSIS — E785 Hyperlipidemia, unspecified: Secondary | ICD-10-CM | POA: Diagnosis present

## 2015-04-02 DIAGNOSIS — Z8679 Personal history of other diseases of the circulatory system: Secondary | ICD-10-CM

## 2015-04-02 DIAGNOSIS — F17210 Nicotine dependence, cigarettes, uncomplicated: Secondary | ICD-10-CM | POA: Diagnosis not present

## 2015-04-02 DIAGNOSIS — I10 Essential (primary) hypertension: Secondary | ICD-10-CM | POA: Diagnosis not present

## 2015-04-02 LAB — COMPREHENSIVE METABOLIC PANEL
ALBUMIN: 3.5 g/dL (ref 3.5–5.0)
ALK PHOS: 71 U/L (ref 38–126)
ALT: 25 U/L (ref 17–63)
ALT: 23 U/L (ref 17–63)
ANION GAP: 12 (ref 5–15)
AST: 25 U/L (ref 15–41)
AST: 21 U/L (ref 15–41)
Albumin: 3.8 g/dL (ref 3.5–5.0)
Alkaline Phosphatase: 76 U/L (ref 38–126)
Anion gap: 8 (ref 5–15)
BILIRUBIN TOTAL: 0.5 mg/dL (ref 0.3–1.2)
BUN: 15 mg/dL (ref 6–20)
BUN: 15 mg/dL (ref 6–20)
CHLORIDE: 108 mmol/L (ref 101–111)
CO2: 26 mmol/L (ref 22–32)
CO2: 23 mmol/L (ref 22–32)
CREATININE: 1.19 mg/dL (ref 0.61–1.24)
Calcium: 9 mg/dL (ref 8.9–10.3)
Calcium: 8.6 mg/dL — ABNORMAL LOW (ref 8.9–10.3)
Chloride: 102 mmol/L (ref 101–111)
Creatinine, Ser: 1.57 mg/dL — ABNORMAL HIGH (ref 0.61–1.24)
GFR calc non Af Amer: >60 mL/min (ref >60)
GFR, EST AFRICAN AMERICAN: 53 mL/min — AB (ref >60)
GFR, EST NON AFRICAN AMERICAN: 46 mL/min — AB (ref >60)
GLUCOSE: 113 mg/dL — AB (ref 65–99)
Glucose, Bld: 125 mg/dL — ABNORMAL HIGH (ref 65–99)
POTASSIUM: 4.6 mmol/L (ref 3.5–5.1)
Potassium: 4.7 mmol/L (ref 3.5–5.1)
SODIUM: 139 mmol/L (ref 135–145)
Sodium: 140 mmol/L (ref 135–145)
TOTAL PROTEIN: 6.5 g/dL (ref 6.5–8.1)
Total Bilirubin: 0.7 mg/dL (ref 0.3–1.2)
Total Protein: 6.1 g/dL — ABNORMAL LOW (ref 6.5–8.1)

## 2015-04-02 LAB — CBC WITH DIFFERENTIAL/PLATELET
Basophils Absolute: 0 10*3/uL (ref 0.0–0.1)
Basophils Relative: 0 %
EOS ABS: 0.1 10*3/uL (ref 0.0–0.7)
Eosinophils Relative: 1 %
HEMATOCRIT: 38.6 % — AB (ref 39.0–52.0)
HEMOGLOBIN: 12.1 g/dL — AB (ref 13.0–17.0)
LYMPHS ABS: 2.3 10*3/uL (ref 0.7–4.0)
Lymphocytes Relative: 29 %
MCH: 28.9 pg (ref 26.0–34.0)
MCHC: 31.3 g/dL (ref 30.0–36.0)
MCV: 92.1 fL (ref 78.0–100.0)
MONO ABS: 0.6 10*3/uL (ref 0.1–1.0)
MONOS PCT: 8 %
NEUTROS ABS: 5 10*3/uL (ref 1.7–7.7)
NEUTROS PCT: 62 %
Platelets: 191 10*3/uL (ref 150–400)
RBC: 4.19 MIL/uL — ABNORMAL LOW (ref 4.22–5.81)
RDW: 13.9 % (ref 11.5–15.5)
WBC: 8 10*3/uL (ref 4.0–10.5)

## 2015-04-02 LAB — URINE MICROSCOPIC-ADD ON: RBC / HPF: NONE SEEN RBC/hpf (ref 0–5)

## 2015-04-02 LAB — URINALYSIS, ROUTINE W REFLEX MICROSCOPIC
BILIRUBIN URINE: NEGATIVE
Glucose, UA: NEGATIVE mg/dL
HGB URINE DIPSTICK: NEGATIVE
Ketones, ur: 15 mg/dL — AB
Nitrite: NEGATIVE
PH: 6.5 (ref 5.0–8.0)
Protein, ur: 30 mg/dL — AB
SPECIFIC GRAVITY, URINE: 1.035 — AB (ref 1.005–1.030)

## 2015-04-02 LAB — RAPID URINE DRUG SCREEN, HOSP PERFORMED
Amphetamines: NOT DETECTED
BARBITURATES: NOT DETECTED
Benzodiazepines: NOT DETECTED
COCAINE: NOT DETECTED
OPIATES: NOT DETECTED
Tetrahydrocannabinol: POSITIVE — AB

## 2015-04-02 LAB — TROPONIN I
Troponin I: <0.03 ng/mL (ref <0.031)
Troponin I: <0.03 ng/mL (ref <0.031)
Troponin I: <0.03 ng/mL (ref <0.031)

## 2015-04-02 LAB — LIPASE, BLOOD: LIPASE: 38 U/L (ref 11–51)

## 2015-04-02 LAB — ETHANOL

## 2015-04-02 MED ORDER — ENOXAPARIN SODIUM 40 MG/0.4ML ~~LOC~~ SOLN: 40.0000 mg | SUBCUTANEOUS | Status: DC

## 2015-04-02 MED ORDER — SODIUM CHLORIDE 0.9 % IV SOLN
INTRAVENOUS | Status: DC
  Administered 2015-04-02: 1000 mL via INTRAVENOUS

## 2015-04-02 MED ORDER — ONDANSETRON HCL 4 MG PO TABS: 4.0000 mg | ORAL_TABLET | Freq: Four times a day (QID) | ORAL | Status: DC | PRN

## 2015-04-02 MED ORDER — HYDRALAZINE HCL 20 MG/ML IJ SOLN: 10.0000 mg | INTRAMUSCULAR | Status: DC | PRN

## 2015-04-02 MED ORDER — ACETAMINOPHEN 650 MG RE SUPP: 650.0000 mg | Freq: Four times a day (QID) | RECTAL | Status: DC | PRN

## 2015-04-02 MED ORDER — PREGABALIN 50 MG PO CAPS
100.0000 mg | ORAL_CAPSULE | Freq: Two times a day (BID) | ORAL | Status: DC
  Administered 2015-04-02 – 2015-04-03 (×3): 100 mg via ORAL
  Filled 2015-04-02: qty 1
  Filled 2015-04-02 (×2): qty 2

## 2015-04-02 MED ORDER — ONDANSETRON HCL 4 MG/2ML IJ SOLN: 4.0000 mg | Freq: Four times a day (QID) | INTRAMUSCULAR | Status: DC | PRN

## 2015-04-02 MED ORDER — ROSUVASTATIN CALCIUM 10 MG PO TABS
10.0000 mg | ORAL_TABLET | Freq: Every evening | ORAL | Status: DC
  Administered 2015-04-02: 10 mg via ORAL
  Filled 2015-04-02 (×2): qty 1

## 2015-04-02 MED ORDER — ASPIRIN EC 325 MG PO TBEC
325.0000 mg | DELAYED_RELEASE_TABLET | Freq: Every day | ORAL | Status: DC
  Administered 2015-04-02 – 2015-04-03 (×2): 325 mg via ORAL
  Filled 2015-04-02 (×2): qty 1

## 2015-04-02 MED ORDER — DICLOFENAC SODIUM 75 MG PO TBEC
75.0000 mg | DELAYED_RELEASE_TABLET | Freq: Every morning | ORAL | Status: DC
  Administered 2015-04-02 – 2015-04-03 (×2): 75 mg via ORAL
  Filled 2015-04-02 (×3): qty 1

## 2015-04-02 MED ORDER — SODIUM CHLORIDE 0.9 % IV SOLN
INTRAVENOUS | Status: DC
  Administered 2015-04-02 (×2): via INTRAVENOUS

## 2015-04-02 MED ORDER — VITAMIN B-1 100 MG PO TABS
100.0000 mg | ORAL_TABLET | Freq: Every day | ORAL | Status: DC
  Administered 2015-04-02 – 2015-04-03 (×2): 100 mg via ORAL
  Filled 2015-04-02 (×2): qty 1

## 2015-04-02 MED ORDER — ACETAMINOPHEN 325 MG PO TABS: 650.0000 mg | ORAL_TABLET | Freq: Four times a day (QID) | ORAL | Status: DC | PRN

## 2015-04-02 NOTE — Progress Notes (Signed)
Pt seen and examined, admitted earlier this am by Dr.Kakrakandy 59/M with HTN, Dyslipidemia admitted with nausea, Vomiting, hypotension and EKG changes after ETOH and cannabis use. Feels better now, denies chest pain or dyspnea Cards input appreciated, EKG with ST segment changes, troponin negative FU ECHo, continue IVF, add thiamine Home tomorrow if stable  Zannie CovePreetha Mackenna Kamer, MD

## 2015-04-02 NOTE — ED Notes (Signed)
Attempted report 

## 2015-04-02 NOTE — H&P (Signed)
[facility] History and Physical  Jaquil Todt ZOX:[id number] DOB: Mar 12, 1955 DOA: 04/01/2015  Referring physician: Dr. Bebe Shaggy. PCP: PROVIDER NOT IN SYSTEM patient is visiting [place]. Specialists: None.  Chief Complaint: Nausea vomiting dizziness and hypotension.  HPI: Wyat Infinger is a 60 y.o. male with history of hypertension hyperlipidemia was traveling from [place] to [place] when he dropped in [place] to visit his friend. Last evening patient was I alcohol and marijuana when patient suddenly developed some nausea vomiting after eating some chili. Patient also became short of breath. EMS was called and patient was found to be hypotensive with blood pressure less than 90 systolic. Patient was given fluid bolus and EKG showed diffuse ST changes concerning for ST elevation. Code STEMI was called and on-call cardiologist Dr. Excell Seltzer was consulted. On arrival patient denies any chest pain and troponins were negative. Patient's blood pressure improved with fluid and at this time cardiologist has recommended to admit for observation and cycle cardiac markers.   Review of Systems: As presented in the history of presenting illness, rest negative.  Past Medical History  Diagnosis Date  . Hypertension   . Hyperlipidemia   . Carpal tunnel syndrome    Past Surgical History  Procedure Laterality Date  . Back surgery    . Partial hip arthroplasty     Social History:  reports that he has been smoking Cigarettes.  He does not have any smokeless tobacco history on file. He reports that he drinks alcohol. He reports that he uses illicit drugs (Marijuana). Where does patient live Home. Can patient participate in ADLs? Yes.  Allergies  Allergen Reactions  . Ciprofloxacin Itching    Family History:  Family History  Problem Relation Age of Onset  . Diabetes Mellitus II Brother       Prior to Admission medications   Medication Sig Start Date End Date Taking?  Authorizing Provider  Ascorbic Acid (VITAMIN C PO) Take 1 tablet by mouth daily.   Yes Historical Provider, MD  Cholecalciferol (VITAMIN D PO) Take 1 tablet by mouth daily.   Yes Historical Provider, MD  CINNAMON PO Take 1 tablet by mouth every evening.   Yes Historical Provider, MD  Cyanocobalamin (VITAMIN B 12 PO) Take 1 tablet by mouth daily.   Yes Historical Provider, MD  GARLIC PO Take 1 tablet by mouth every evening.   Yes Historical Provider, MD  Ginger, Zingiber officinalis, (GINGER PO) Take 1 tablet by mouth every evening.   Yes Historical Provider, MD  Multiple Vitamins-Minerals (ZINC PO) Take 1 tablet by mouth daily.   Yes Historical Provider, MD  olmesartan (BENICAR) 20 MG tablet Take 20 mg by mouth daily.   Yes Historical Provider, MD  POTASSIUM PO Take 1 tablet by mouth daily.   Yes Historical Provider, MD  pregabalin (LYRICA) 100 MG capsule Take 100 mg by mouth 2 (two) times daily.   Yes Historical Provider, MD  rosuvastatin (CRESTOR) 10 MG tablet Take 10 mg by mouth every evening.    Yes Historical Provider, MD  TURMERIC PO Take 1 tablet by mouth every evening.   Yes Historical Provider, MD    Physical Exam: Filed Vitals:   04/02/15 0130 04/02/15 0200 04/02/15 0230 04/02/15 0347  BP: 97/53 103/52 102/65 115/65  Pulse: 53 53 50 52  Temp:      TempSrc:      Resp: Height:      Weight:      SpO2: 94% 97% 95% 96%  General:  Moderately built and nourished.  Eyes: Anicteric no pallor.  ENT: No discharge from the ears eyes nose and mouth.  Neck: No mass felt.  Cardiovascular: S1-S2 heard.  Respiratory: No rhonchi or crepitations.  Abdomen: Soft nontender bowel sounds present.  Skin: No rash.  Musculoskeletal: No edema.  Psychiatric: Appears normal.  Neurologic: Alert awake oriented to time place and person. Moves all extremities.  Labs on Admission:  Basic Metabolic Panel:  Recent Labs Lab 04/01/15 2313  NA 140  K 4.6  CL 102  CO2 26   GLUCOSE 125*  BUN 15  CREATININE 1.57*  CALCIUM 9.0   Liver Function Tests:  Recent Labs Lab 04/01/15 2313  AST 25  ALT 25  ALKPHOS 76  BILITOT 0.5  PROT 6.5  ALBUMIN 3.8    Recent Labs Lab 04/01/15 2313  LIPASE 38   No results for input(s): AMMONIA in the last 168 hours. CBC:  Recent Labs Lab 04/01/15 2313  WBC 6.9  NEUTROABS 3.5  HGB 12.9*  HCT 41.1  MCV 92.2  PLT 193   Cardiac Enzymes:  Recent Labs Lab 04/01/15 2313  TROPONINI <0.03    BNP (last 3 results) No results for input(s): BNP in the last 8760 hours.  ProBNP (last 3 results) No results for input(s): PROBNP in the last 8760 hours.  CBG: No results for input(s): GLUCAP in the last 168 hours.  Radiological Exams on Admission: Dg Chest Portable 1 View  04/01/2015  CLINICAL DATA:  Acute onset of generalized chest pain and shortness of breath. Initial encounter. EXAM: PORTABLE CHEST 1 VIEW COMPARISON:  None. FINDINGS: The lungs are mildly hypoexpanded but grossly clear. There is no evidence of focal opacification, pleural effusion or pneumothorax. The cardiomediastinal silhouette is within normal limits. No acute osseous abnormalities are seen. IMPRESSION: Lungs mildly hypoexpanded but grossly clear. Electronically Signed   By: Roanna RaiderJeffery  Chang M.D.   On: 04/01/2015 23:53    EKG: Independently reviewed. Normal sinus rhythm with diffuse ST-T changes.  Assessment/Plan Principal Problem:   Near syncope Active Problems:   Hypotension   History of hypertension   HLD (hyperlipidemia)   1. Hypotension/near syncope - patient was hypotensive after an episode of nausea vomiting. May be vasovagal. However given patient's EKG changes we will cycle cardiac markers continue hydration and hold antihypertensives for now. Patient does not look septic. 2. Abnormal EKG - appreciate cardiology consult. Patient has no chest pain at this time. Cardiologist at this time wanted to rule out ACS and pericarditis.  We'll cycle cardiac markers checked 2-D echo. Aspirin. Patient states his primary care had previously told that he had some abnormal EKG changes. No old EKG available at this time. 3. History of hyperlipidemia on statins. 4. Hypertension presently holding antihypertensive due to #1. 5. Elevated creatinine - no old creatinine to compare. Check UA and follow metabolic panel.   DVT Prophylaxis SCDs.  Code Status: Full code.  Family Communication: Discussed with patient.  Disposition Plan: Admit for observation.    Cosima Prentiss N. [facility] Pager [phone].  If 7PM-7AM, please contact night-coverage [email or web address] Password [email or web address] 04/02/2015, 5:09 AM

## 2015-04-03 ENCOUNTER — Observation Stay (HOSPITAL_BASED_OUTPATIENT_CLINIC_OR_DEPARTMENT_OTHER): Payer: Medicare Other

## 2015-04-03 DIAGNOSIS — R9431 Abnormal electrocardiogram [ECG] [EKG]: Secondary | ICD-10-CM

## 2015-04-03 DIAGNOSIS — Z8679 Personal history of other diseases of the circulatory system: Secondary | ICD-10-CM

## 2015-04-03 DIAGNOSIS — E785 Hyperlipidemia, unspecified: Secondary | ICD-10-CM

## 2015-04-03 DIAGNOSIS — I9589 Other hypotension: Secondary | ICD-10-CM | POA: Diagnosis not present

## 2015-04-03 DIAGNOSIS — R55 Syncope and collapse: Secondary | ICD-10-CM | POA: Diagnosis not present

## 2015-04-03 LAB — BASIC METABOLIC PANEL
ANION GAP: 11 (ref 5–15)
BUN: 13 mg/dL (ref 6–20)
CHLORIDE: 105 mmol/L (ref 101–111)
CO2: 27 mmol/L (ref 22–32)
Calcium: 9.3 mg/dL (ref 8.9–10.3)
Creatinine, Ser: 1.04 mg/dL (ref 0.61–1.24)
Glucose, Bld: 101 mg/dL — ABNORMAL HIGH (ref 65–99)
POTASSIUM: 4.5 mmol/L (ref 3.5–5.1)
SODIUM: 143 mmol/L (ref 135–145)

## 2015-04-03 LAB — ECHOCARDIOGRAM COMPLETE
HEIGHTINCHES: 67 in
Weight: 4091.74 oz

## 2015-04-03 LAB — CBC
HCT: 39.4 % (ref 39.0–52.0)
Hemoglobin: 12.1 g/dL — ABNORMAL LOW (ref 13.0–17.0)
MCH: 28.2 pg (ref 26.0–34.0)
MCHC: 30.7 g/dL (ref 30.0–36.0)
MCV: 91.8 fL (ref 78.0–100.0)
PLATELETS: 173 10*3/uL (ref 150–400)
RBC: 4.29 MIL/uL (ref 4.22–5.81)
RDW: 14 % (ref 11.5–15.5)
WBC: 5.8 10*3/uL (ref 4.0–10.5)

## 2015-04-03 LAB — D-DIMER, QUANTITATIVE: D-Dimer, Quant: 0.34 ug/mL-FEU (ref 0.00–0.50)

## 2015-04-03 LAB — C-REACTIVE PROTEIN

## 2015-04-03 LAB — SEDIMENTATION RATE: SED RATE: 14 mm/h (ref 0–16)

## 2015-04-03 MED ORDER — PROMETHAZINE HCL 12.5 MG PO TABS: 12.5000 mg | ORAL_TABLET | Freq: Four times a day (QID) | ORAL | Status: AC | PRN

## 2015-04-03 MED ORDER — ASPIRIN EC 81 MG PO TBEC: 81.0000 mg | DELAYED_RELEASE_TABLET | Freq: Every day | ORAL | Status: AC

## 2015-04-03 MED ORDER — THIAMINE HCL 100 MG PO TABS: 100.0000 mg | ORAL_TABLET | Freq: Every day | ORAL | Status: AC

## 2015-04-03 NOTE — Progress Notes (Signed)
Patient discharge paperwork gone over in detail. All questions answered to patients satisfaction. Telemetry discontinued, IV's removed intact. Patient discharged to home with friend by way of wheelchair.

## 2015-04-03 NOTE — Progress Notes (Signed)
    Subjective:  Denies CP or dyspnea   Objective:  Filed Vitals:   04/02/15 1406 04/02/15 2006 04/03/15 0030 04/03/15 0545  BP: 130/56 126/65 118/61 143/73  Pulse: 63 57 52 48  Temp: 97.8 F (36.6 C) 98 F (36.7 C) 98 F (36.7 C) 98.3 F (36.8 C)  TempSrc: Oral Oral Oral Oral  Resp: 16 20 20 18   Height:      Weight: 261 lb 9.6 oz (118.661 kg)   255 lb 11.7 oz (116 kg)  SpO2: 99% 97% 96% 96%    Intake/Output from previous day:  Intake/Output Summary (Last 24 hours) at 04/03/15 0947 Last data filed at 04/03/15 0030  Gross per 24 hour  Intake 2535.75 ml  Output    675 ml  Net 1860.75 ml    Physical Exam: Physical exam: Well-developed well-nourished in no acute distress.  Skin is warm and dry.  HEENT is normal.  Neck is supple.  Chest is clear to auscultation with normal expansion.  Cardiovascular exam is regular rate and rhythm.  Abdominal exam nontender or distended. No masses palpated. Extremities show no edema. neuro grossly intact    Lab Results: Basic Metabolic Panel:  Recent Labs  16/10/9601/18/17 0528 04/03/15 0659  NA 139 143  K 4.7 4.5  CL 108 105  CO2 23 27  GLUCOSE 113* 101*  BUN 15 13  CREATININE 1.19 1.04  CALCIUM 8.6* 9.3   CBC:  Recent Labs  04/01/15 2313 04/02/15 0528 04/03/15 0659  WBC 6.9 8.0 5.8  NEUTROABS 3.5 5.0  --   HGB 12.9* 12.1* 12.1*  HCT 41.1 38.6* 39.4  MCV 92.2 92.1 91.8  PLT 193 191 173   Cardiac Enzymes:  Recent Labs  04/02/15 0528 04/02/15 1221 04/02/15 1639  TROPONINI <0.03 <0.03 <0.03     Assessment/Plan:  1 Hypotension-resolved with IV fluids. 2 mildly abnormal electrocardiogram-patient was not having any cardiac symptoms at time of admission. Enzymes negative. Echocardiogram is pending. If negative patient can be discharged from a cardiac standpoint. If echo cannot be performed today patient can be discharged and have this performed as an outpatient with his primary care physician. Other issues per  primary care.  Olga MillersBrian Hence Derrick 04/03/2015, 9:47 AM

## 2015-04-03 NOTE — Discharge Summary (Signed)
Physician Discharge Summary   Patient ID: Austin Bowers MRN: [id number] DOB/AGE: [birth year] 60 y.o.  Admit date: 3/1[name]7/2017 Discharge date: 04/03/2015  Primary Care Physician:  Patient is from [place], traveling through [place]  Discharge Diagnoses:      Nausea/vomiting due to gastroenteritis resolved  . Hypotension . HLD (hyperlipidemia) . Near syncope  Consults:  Cardiology  Recommendations for Outpatient Follow-up:  1. Please repeat CBC/BMET at next visit   DIET: Heart healthy diet    Allergies:   Allergies  Allergen Reactions  . Ciprofloxacin Itching     DISCHARGE MEDICATIONS: Current Discharge Medication List    START taking these medications   Details  aspirin EC 81 MG tablet Take 1 tablet (81 mg total) by mouth daily. Qty: 30 tablet, Refills: 3    promethazine (PHENERGAN) 12.5 MG tablet Take 1 tablet (12.5 mg total) by mouth every 6 (six) hours as needed for nausea or vomiting. Qty: 30 tablet, Refills: 0    thiamine 100 MG tablet Take 1 tablet (100 mg total) by mouth daily. Qty: 30 tablet, Refills: 1      CONTINUE these medications which have NOT CHANGED   Details  Ascorbic Acid (VITAMIN C PO) Take 1 tablet by mouth daily.    Cholecalciferol (VITAMIN D PO) Take 1 tablet by mouth daily.    CINNAMON PO Take 1 tablet by mouth every evening.    Cyanocobalamin (VITAMIN B 12 PO) Take 1 tablet by mouth daily.    diclofenac (VOLTAREN) 75 MG EC tablet Take 75 mg by mouth daily.    GARLIC PO Take 1 tablet by mouth every evening.    Ginger, Zingiber officinalis, (GINGER PO) Take 1 tablet by mouth every evening.    Multiple Vitamins-Minerals (ZINC PO) Take 1 tablet by mouth daily.    POTASSIUM PO Take 1 tablet by mouth daily.    pregabalin (LYRICA) 100 MG capsule Take 100 mg by mouth 2 (two) times daily.    rosuvastatin (CRESTOR) 10 MG tablet Take 10 mg by mouth every evening.     TURMERIC PO Take 1 tablet by mouth every evening.      STOP  taking these medications     olmesartan (BENICAR) 20 MG tablet          Brief H and P: For complete details please refer to admission H and P, but in briefEdwin Orson AloeHenderson is a 60 y.o. male with history of hypertension hyperlipidemia was traveling from [place] to [place] when he dropped in [place] to visit his friend. On the evening of admission, patient had alcohol, marijuana, then suddenly developed nausea and vomiting after eating some chili. Patient also became short of breath. EMS was called and patient was found to be hypotensive with blood pressure less than 90 systolic. Patient was given fluid bolus and EKG showed diffuse ST changes concerning for ST elevation. Code STEMI was called and on-call cardiologist Dr. Excell Seltzerooper was consulted. On arrival patient denied any chest pain and troponins were negative. Patient's blood pressure improved with fluid and at this time cardiologist has recommended to admit for observation and cycle cardiac markers.  Hospital Course:     Near syncope: Likely due to dehydration, nausea, vomiting and hypotension versus vasovagal - Resolved, patient was admitted for further workup. Serial cardiac enzymes were negative. Initially there was a question of possible ACS, EKG showed minimal diffuse ST elevation possibly acute injury/pericarditis or normal variant per cardiology. Patient did not have any chest pain. Per cardiology, EKG did not  meet criteria for STEMI. Patient was placed on IV fluids, ruled out for acute ACS. 2-D echo showed EF 55-60%, grade 1 diastolic dysfunction, cleared by cardiology to discharge home.      Hypotension with history of hypertension - Resolved, no nausea or vomiting, patient tolerating solid diet. - Benicar was held. Patient was recommended to hold Benicar for 3 days and if BP elevated, he may resume Benicar at half dose.       HLD (hyperlipidemia): Continue statin  Alcohol and marijuana use Patient was strongly counseled  to quit both.   Day of Discharge BP 143/73 mmHg  Pulse 48  Temp(Src) 98.3 F (36.8 C) (Oral)  Resp 18  Ht  (1.702 m)  Wt 116 kg (255 lb 11.7 oz)  BMI 40.04 kg/m2  SpO2 96%  Physical Exam: General: Alert and awake oriented x3 not in any acute distress. HEENT: anicteric sclera, pupils reactive to light and accommodation CVS: S1-S2 clear no murmur rubs or gallops Chest: clear to auscultation bilaterally, no wheezing rales or rhonchi Abdomen: soft nontender, nondistended, normal bowel sounds Extremities: no cyanosis, clubbing or edema noted bilaterally Neuro: Cranial nerves II-XII intact, no focal neurological deficits   The results of significant diagnostics from this hospitalization (including imaging, microbiology, ancillary and laboratory) are listed below for reference.    LAB RESULTS: Basic Metabolic Panel:  Recent Labs Lab 04/02/15 0528 04/03/15 0659  NA 139 143  K 4.7 4.5  CL 108 105  CO2 23 27  GLUCOSE 113* 101*  BUN 15 13  CREATININE 1.19 1.04  CALCIUM 8.6* 9.3   Liver Function Tests:  Recent Labs Lab 04/01/15 2313 04/02/15 0528  AST 25 21  ALT 25 23  ALKPHOS 76 71  BILITOT 0.5 0.7  PROT 6.5 6.1*  ALBUMIN 3.8 3.5    Recent Labs Lab 04/01/15 2313  LIPASE 38   No results for input(s): AMMONIA in the last 168 hours. CBC:  Recent Labs Lab 04/02/15 0528 04/03/15 0659  WBC 8.0 5.8  NEUTROABS 5.0  --   HGB 12.1* 12.1*  HCT 38.6* 39.4  MCV 92.1 91.8  PLT 191 173   Cardiac Enzymes:  Recent Labs Lab 04/02/15 1221 04/02/15 1639  TROPONINI <0.03 <0.03   BNP: Invalid input(s): POCBNP CBG: No results for input(s): GLUCAP in the last 168 hours.  Significant Diagnostic Studies:  Dg Chest Portable 1 View  04/01/2015  CLINICAL DATA:  Acute onset of generalized chest pain and shortness of breath. Initial encounter. EXAM: PORTABLE CHEST 1 VIEW COMPARISON:  None. FINDINGS: The lungs are mildly hypoexpanded but grossly clear. There is no  evidence of focal opacification, pleural effusion or pneumothorax. The cardiomediastinal silhouette is within normal limits. No acute osseous abnormalities are seen. IMPRESSION: Lungs mildly hypoexpanded but grossly clear. Electronically Signed   By: Roanna Raider M.D.   On: 04/01/2015 23:53    2D ECHO: Study Conclusions  - Left ventricle: The cavity size was normal. Wall thickness was  normal. Systolic function was normal. The estimated ejection  fraction was in the range of 55% to 60%. Wall motion was normal;  there were no regional wall motion abnormalities. Doppler  parameters are consistent with abnormal left ventricular  relaxation (grade 1 diastolic dysfunction). - Aortic valve: There was trivial regurgitation. - Left atrium: The atrium was mildly dilated.  Impressions:  - Normal LV systolic function; grade 1 diastolic dysfunction; mild  LAE; trace AI.  Disposition and Follow-up: Discharge Instructions    Diet -  low sodium heart healthy    Complete by:  As directed      Discharge instructions    Complete by:  As directed   Stay hydrated. Please HOLD benicar for next 3 days and check your BP daily. If your BP is consistently elevated more than 140/90, you can restart benicar  tab daily.     Increase activity slowly    Complete by:  As directed             DISPOSITION: home    DISCHARGE FOLLOW-UP Follow-up Information    Schedule an appointment as soon as possible for a visit in 2 weeks to follow up.   Why:  for hospital follow-up   Contact information:   Please follow with PCP in [place].        Time spent on Discharge: 25 minutes   Signed:   Yamil Dougher M.D. [facility] 04/03/2015, 11:38 AM Pager: [phone]

## 2015-04-03 NOTE — Progress Notes (Signed)
  Echocardiogram 2D Echocardiogram has been performed.  Arvil ChacoFoster, [place] Me[place] 04/03/2015, 11:21 AM

## 2015-04-21 ENCOUNTER — Other Ambulatory Visit: Payer: Self-pay | Admitting: Orthopaedic Surgery

## 2015-05-13 ENCOUNTER — Other Ambulatory Visit: Payer: Self-pay | Admitting: Orthopaedic Surgery

## 2015-05-13 ENCOUNTER — Ambulatory Visit: Payer: Medicare Other

## 2015-05-27 ENCOUNTER — Ambulatory Visit: Payer: Medicare Other | Attending: Orthopaedic Surgery

## 2015-05-27 ENCOUNTER — Ambulatory Visit: Payer: Medicare Other

## 2015-05-27 DIAGNOSIS — Z01818 Encounter for other preprocedural examination: Secondary | ICD-10-CM | POA: Insufficient documentation

## 2015-05-27 LAB — TYPE AND SCREEN
AB Screen Gel: NEGATIVE
ABO Rh: B POS

## 2015-05-27 LAB — VITAMIN D,25 OH,TOTAL: Vitamin D, 25 OH, Total: 50 ng/mL (ref 30–100)

## 2015-05-27 LAB — HEMOLYSIS INDEX: Hemolysis Index: 6 (ref 0–18)

## 2015-05-27 NOTE — Pre-Procedure Instructions (Addendum)
   Procedure Verified   Pt ID verified   Arrival time at 1030 w read back , parking at [facility], check in at surgery center, pt read back   Nothing to eat and drink after midnight, no chewing gum, mints, candy, ice pt read back   Pt states he will come to [facility] Monday 05/30/15 for lab work, Publishing copy. WIll bring order, unable to confirm time pt will come   Pt states he had MRSA/MSSA done with PCP- will request   Pre-op done 05/17/15 in [facility])   CXR 05/13/15 in [facility]   CHG instructions reviewed with pt, pt read back   Patient able to verbalize understanding of education and materials.   Patient's questions were answered   Patient denies any further questions   Ride arranged with   Addendum   Labs including MRSA/MSSA (nares and throat) done 05/11/15 at [facility] [place]

## 2015-05-30 NOTE — Discharge Instructions (Signed)
[facility] booklet to be provided and reviewed -    Spine Wound Care information sheet to be provided and reviewed -    Constipation Information sheet to be provided and reviewed   -    Mixing alcohol and pain medications can cause dizziness and slow your breathing.    Don't drink alcoholic beverages while taking pain medications.         Ronald C. Childs, MD  Orthopaedic Spine Surgery    Lumbar Fusion Surgery  Discharge Instructions    WOUND CARE:    Your wound will be closed with absorbable stitches and covered with steri-strips, gauze and tape. Change the gauze dressing daily for seven days after surgery. If you notice any swelling, redness, or excessive drainage, call our office at [phone]. You may shower on the 5th day after surgery. Use caution to avoid letting the water pound on your incision. Do not soak your incision in the bath tub, hot tub, or pool until 4 weeks after surgery. You may purchase dressing supplies at [facility] located in the west corridor of the tower lobby.    ACTIVITY:    While in the hospital, you will be seen by occupational and physical therapists who will help you to sit in a chair and start walking and learn to climb stairs. After two to three days, you will be discharged to go home. You are encouraged to walk and change positions frequently. Some patients may need to have a physical therapist come to their home for a few visits. Some patients may need a walker and/or a bedside commode for a short time after arriving home. Do not bend, twist, or lift anything greater than a gallon of milk until re-evaluation. Do not use anti-inflammatory medications for two months after surgery. You may drive when you feel comfortable and are no longer using narcotic medications. Your doctor will discuss with you when you will be able to return to work.    FOLLOW UP:    You will be given a post-op appointment at two weeks after surgery at which time X-rays will be  taken. You will start physical therapy at 3 months after surgery. Should you have further questions or concerns at any point during your recovery, please do not hesitate to contact our office at [phone].    [address]  [address]  [place], [place]    [address]  Phone: [phone]  Fax:[phone]

## 2015-05-30 NOTE — Plan of Care (Signed)
Problem: Health Promotion  Goal: Vaccination Screening  All patients will be screened for current vaccination status on each admission.  Outcome: Completed Date Met:  05/30/15  Goal: Risk control - tobacco abuse  Actions to eliminate or reduce tobacco use.  Outcome: Completed Date Met:  05/30/15  Quit smoking December 01, 1998

## 2015-05-31 ENCOUNTER — Encounter
Admission: RE | Disposition: A | Discharge status: Home / Self Care | Payer: Self-pay | Source: Ambulatory Visit | Attending: Orthopaedic Surgery

## 2015-05-31 ENCOUNTER — Inpatient Hospital Stay: Payer: Medicare Other

## 2015-05-31 ENCOUNTER — Inpatient Hospital Stay: Payer: Medicare Other | Admitting: Anesthesiology

## 2015-05-31 ENCOUNTER — Inpatient Hospital Stay: Payer: Medicare Other | Admitting: Orthopaedic Surgery

## 2015-05-31 ENCOUNTER — Inpatient Hospital Stay
Admission: RE | Admit: 2015-05-31 | Discharge: 2015-06-02 | DRG: 460 | Disposition: A | Discharge status: Home / Self Care | Payer: Medicare Other | Source: Ambulatory Visit | Attending: Orthopaedic Surgery | Admitting: Orthopaedic Surgery

## 2015-05-31 DIAGNOSIS — M4316 Spondylolisthesis, lumbar region: Secondary | ICD-10-CM | POA: Diagnosis present

## 2015-05-31 DIAGNOSIS — Z79899 Other long term (current) drug therapy: Secondary | ICD-10-CM

## 2015-05-31 DIAGNOSIS — E785 Hyperlipidemia, unspecified: Secondary | ICD-10-CM | POA: Diagnosis present

## 2015-05-31 DIAGNOSIS — M5416 Radiculopathy, lumbar region: Principal | ICD-10-CM | POA: Diagnosis present

## 2015-05-31 DIAGNOSIS — M4806 Spinal stenosis, lumbar region: Secondary | ICD-10-CM | POA: Diagnosis present

## 2015-05-31 DIAGNOSIS — M960 Pseudarthrosis after fusion or arthrodesis: Secondary | ICD-10-CM | POA: Diagnosis present

## 2015-05-31 DIAGNOSIS — K219 Gastro-esophageal reflux disease without esophagitis: Secondary | ICD-10-CM | POA: Diagnosis present

## 2015-05-31 DIAGNOSIS — Z87891 Personal history of nicotine dependence: Secondary | ICD-10-CM

## 2015-05-31 DIAGNOSIS — Q7649 Other congenital malformations of spine, not associated with scoliosis: Secondary | ICD-10-CM

## 2015-05-31 DIAGNOSIS — E7800 Pure hypercholesterolemia, unspecified: Secondary | ICD-10-CM | POA: Diagnosis present

## 2015-05-31 DIAGNOSIS — I119 Hypertensive heart disease without heart failure: Secondary | ICD-10-CM | POA: Diagnosis present

## 2015-05-31 HISTORY — PX: LAMINECTOMY, POSTERIOR LUMBAR, DECOMP, FUSION, LEVEL 2: SHX4453

## 2015-05-31 HISTORY — DX: Unspecified visual disturbance: H53.9

## 2015-05-31 SURGERY — LAMINECTOMY, POSTERIOR LUMBAR, DECOMP, FUSION, LEVEL 2
Anesthesia: Anesthesia General | Site: Spine Lumbar | Wound class: Clean

## 2015-05-31 MED ORDER — FLEET ENEMA 7-19 GM/118ML RE ENEM
1.0000 | ENEMA | Freq: Once | RECTAL | Status: DC | PRN
Start: 2015-05-31 — End: 2015-06-02

## 2015-05-31 MED ORDER — DEXAMETHASONE SODIUM PHOSPHATE 20 MG/5ML IJ SOLN
INTRAMUSCULAR | Status: AC
Start: 2015-05-31 — End: ?
  Filled 2015-05-31: qty 5

## 2015-05-31 MED ORDER — PREGABALIN 75 MG PO CAPS
75.0000 mg | ORAL_CAPSULE | Freq: Once | ORAL | Status: AC
Start: 2015-05-31 — End: 2015-05-31
  Administered 2015-05-31: 75 mg via ORAL

## 2015-05-31 MED ORDER — ROSUVASTATIN CALCIUM 10 MG PO TABS
10.0000 mg | ORAL_TABLET | Freq: Every evening | ORAL | Status: DC
Start: 2015-05-31 — End: 2015-06-02
  Administered 2015-05-31 – 2015-06-01 (×2): 10 mg via ORAL
  Filled 2015-05-31 (×2): qty 1

## 2015-05-31 MED ORDER — HYDROMORPHONE HCL 1 MG/ML IJ SOLN
1.0000 mg | INTRAMUSCULAR | Status: DC | PRN
Start: 2015-05-31 — End: 2015-06-02

## 2015-05-31 MED ORDER — FENTANYL CITRATE (PF) 50 MCG/ML IJ SOLN (WRAP)
25.0000 ug | INTRAMUSCULAR | Status: DC | PRN
Start: 2015-05-31 — End: 2015-05-31

## 2015-05-31 MED ORDER — CEFAZOLIN SODIUM-DEXTROSE 2-5 GM/50ML-% IV SOLN
2.0000 g | Freq: Three times a day (TID) | INTRAVENOUS | Status: AC
Start: 2015-06-01 — End: 2015-06-01
  Administered 2015-06-01 (×2): 2 g via INTRAVENOUS
  Filled 2015-05-31 (×2): qty 50

## 2015-05-31 MED ORDER — ACETAMINOPHEN 500 MG PO TABS
ORAL_TABLET | ORAL | Status: AC
Start: 2015-05-31 — End: ?
  Filled 2015-05-31: qty 2

## 2015-05-31 MED ORDER — ACETAMINOPHEN 500 MG PO TABS
1000.0000 mg | ORAL_TABLET | Freq: Once | ORAL | Status: AC
Start: 2015-05-31 — End: 2015-05-31
  Administered 2015-05-31: 1000 mg via ORAL

## 2015-05-31 MED ORDER — CELECOXIB 200 MG PO CAPS
ORAL_CAPSULE | ORAL | Status: AC
Start: 2015-05-31 — End: ?
  Filled 2015-05-31: qty 1

## 2015-05-31 MED ORDER — GLYCOPYRROLATE 0.2 MG/ML IJ SOLN
INTRAMUSCULAR | Status: AC
Start: 2015-05-31 — End: ?
  Filled 2015-05-31: qty 4

## 2015-05-31 MED ORDER — BACITRACIN 50000 UNITS IM SOLR
INTRAMUSCULAR | Status: DC | PRN
Start: 2015-05-31 — End: 2015-05-31
  Administered 2015-05-31: 50000 [IU]

## 2015-05-31 MED ORDER — ROCURONIUM BROMIDE 50 MG/5ML IV SOLN
INTRAVENOUS | Status: AC
Start: 2015-05-31 — End: ?
  Filled 2015-05-31: qty 5

## 2015-05-31 MED ORDER — FAMOTIDINE 20 MG/2ML IV SOLN
INTRAVENOUS | Status: AC
Start: 2015-05-31 — End: ?
  Filled 2015-05-31: qty 2

## 2015-05-31 MED ORDER — EPINEPHRINE HCL 1 MG/ML IJ SOLN (WRAP)
Status: DC | PRN
Start: 2015-05-31 — End: 2015-05-31
  Administered 2015-05-31: 18:00:00 1 mg

## 2015-05-31 MED ORDER — EPHEDRINE SULFATE 50 MG/ML IJ SOLN
INTRAMUSCULAR | Status: DC | PRN
Start: 2015-05-31 — End: 2015-05-31
  Administered 2015-05-31 (×2): 10 mg via INTRAVENOUS

## 2015-05-31 MED ORDER — FAMOTIDINE 10 MG/ML IV SOLN (WRAP)
INTRAVENOUS | Status: DC | PRN
Start: 2015-05-31 — End: 2015-05-31
  Administered 2015-05-31: 20 mg via INTRAVENOUS

## 2015-05-31 MED ORDER — BISACODYL 10 MG RE SUPP
10.0000 mg | Freq: Every day | RECTAL | Status: DC | PRN
Start: 2015-05-31 — End: 2015-06-02

## 2015-05-31 MED ORDER — NEOSTIGMINE METHYLSULFATE 1 MG/ML IJ SOLN
INTRAMUSCULAR | Status: AC
Start: 2015-05-31 — End: ?
  Filled 2015-05-31: qty 5

## 2015-05-31 MED ORDER — NEOSTIGMINE METHYLSULFATE 1 MG/ML IJ SOLN
INTRAMUSCULAR | Status: DC | PRN
Start: 2015-05-31 — End: 2015-05-31
  Administered 2015-05-31: 5 mg via INTRAVENOUS

## 2015-05-31 MED ORDER — BENZOCAINE-MENTHOL 15-3.6 MG MT LOZG
1.0000 | LOZENGE | OROMUCOSAL | Status: DC | PRN
Start: 2015-05-31 — End: 2015-06-02
  Administered 2015-06-01: 1 via BUCCAL
  Filled 2015-05-31: qty 1

## 2015-05-31 MED ORDER — ONDANSETRON HCL 4 MG/2ML IJ SOLN
4.0000 mg | Freq: Once | INTRAMUSCULAR | Status: DC | PRN
Start: 2015-05-31 — End: 2015-05-31

## 2015-05-31 MED ORDER — VALSARTAN 160 MG PO TABS
320.0000 mg | ORAL_TABLET | Freq: Every day | ORAL | Status: DC
Start: 2015-06-01 — End: 2015-05-31

## 2015-05-31 MED ORDER — SODIUM CHLORIDE 0.9 % IR SOLN
Status: DC | PRN
Start: 2015-05-31 — End: 2015-05-31
  Administered 2015-05-31: 250 mL
  Administered 2015-05-31: 1000 mL

## 2015-05-31 MED ORDER — ALBUMIN HUMAN 5 % IV SOLN
INTRAVENOUS | Status: DC | PRN
Start: 2015-05-31 — End: 2015-05-31

## 2015-05-31 MED ORDER — LACTATED RINGERS IV SOLN
INTRAVENOUS | Status: DC | PRN
Start: 2015-05-31 — End: 2015-05-31

## 2015-05-31 MED ORDER — DEXAMETHASONE SODIUM PHOSPHATE 4 MG/ML IJ SOLN (WRAP)
INTRAMUSCULAR | Status: DC | PRN
Start: 2015-05-31 — End: 2015-05-31
  Administered 2015-05-31: 10 mg via INTRAVENOUS

## 2015-05-31 MED ORDER — SODIUM CHLORIDE BACTERIOSTATIC 0.9 % IJ SOLN
INTRAMUSCULAR | Status: DC | PRN
Start: 2015-05-31 — End: 2015-05-31
  Administered 2015-05-31: 9 mL

## 2015-05-31 MED ORDER — PHENYLEPHRINE 100 MCG/ML IN NACL 0.9% IV SOSY
PREFILLED_SYRINGE | INTRAVENOUS | Status: DC | PRN
Start: 2015-05-31 — End: 2015-05-31
  Administered 2015-05-31: 200 ug via INTRAVENOUS
  Administered 2015-05-31 (×2): 100 ug via INTRAVENOUS

## 2015-05-31 MED ORDER — ONDANSETRON HCL 4 MG/2ML IJ SOLN
INTRAMUSCULAR | Status: DC | PRN
Start: 2015-05-31 — End: 2015-05-31
  Administered 2015-05-31: 4 mg via INTRAVENOUS

## 2015-05-31 MED ORDER — LACTATED RINGERS IV SOLN
INTRAVENOUS | Status: DC
Start: 2015-05-31 — End: 2015-06-02

## 2015-05-31 MED ORDER — MAGNESIUM HYDROXIDE 400 MG/5ML PO SUSP
10.0000 mL | ORAL | Status: DC | PRN
Start: 2015-05-31 — End: 2015-06-02

## 2015-05-31 MED ORDER — MIDAZOLAM HCL 2 MG/2ML IJ SOLN
INTRAMUSCULAR | Status: DC | PRN
Start: 2015-05-31 — End: 2015-05-31
  Administered 2015-05-31: 2 mg via INTRAVENOUS

## 2015-05-31 MED ORDER — LABETALOL HCL 5 MG/ML IV SOLN
INTRAVENOUS | Status: DC | PRN
Start: 2015-05-31 — End: 2015-05-31
  Administered 2015-05-31: 10 mg via INTRAVENOUS

## 2015-05-31 MED ORDER — LABETALOL HCL 5 MG/ML IV SOLN
INTRAVENOUS | Status: AC
Start: 2015-05-31 — End: ?
  Filled 2015-05-31: qty 20

## 2015-05-31 MED ORDER — LIDOCAINE HCL 2 % IJ SOLN
INTRAMUSCULAR | Status: DC | PRN
Start: 2015-05-31 — End: 2015-05-31
  Administered 2015-05-31: 100 mg

## 2015-05-31 MED ORDER — SODIUM CHLORIDE 0.9 % IV SOLN
INTRAVENOUS | Status: DC
Start: 2015-05-31 — End: 2015-06-02

## 2015-05-31 MED ORDER — GLYCOPYRROLATE 0.2 MG/ML IJ SOLN
INTRAMUSCULAR | Status: DC | PRN
Start: 2015-05-31 — End: 2015-05-31
  Administered 2015-05-31: .8 mg via INTRAVENOUS

## 2015-05-31 MED ORDER — PREGABALIN 75 MG PO CAPS
ORAL_CAPSULE | ORAL | Status: AC
Start: 2015-05-31 — End: ?
  Filled 2015-05-31: qty 1

## 2015-05-31 MED ORDER — OXYCODONE-ACETAMINOPHEN 5-325 MG PO TABS
1.0000 | ORAL_TABLET | Freq: Once | ORAL | Status: DC | PRN
Start: 2015-05-31 — End: 2015-05-31

## 2015-05-31 MED ORDER — PREGABALIN 50 MG PO CAPS
100.0000 mg | ORAL_CAPSULE | Freq: Two times a day (BID) | ORAL | Status: DC
Start: 2015-05-31 — End: 2015-06-02
  Administered 2015-05-31 – 2015-06-02 (×4): 100 mg via ORAL
  Filled 2015-05-31 (×4): qty 2

## 2015-05-31 MED ORDER — OXYCODONE HCL 5 MG PO TABS
10.0000 mg | ORAL_TABLET | ORAL | Status: DC | PRN
Start: 2015-05-31 — End: 2015-06-02
  Administered 2015-06-01 – 2015-06-02 (×6): 10 mg via ORAL
  Filled 2015-05-31 (×6): qty 2

## 2015-05-31 MED ORDER — LUBRIFRESH P.M. OP OINT
TOPICAL_OINTMENT | OPHTHALMIC | Status: AC
Start: 2015-05-31 — End: ?
  Filled 2015-05-31: qty 3.5

## 2015-05-31 MED ORDER — PROPOFOL 10 MG/ML IV EMUL (WRAP)
INTRAVENOUS | Status: AC
Start: 2015-05-31 — End: ?
  Filled 2015-05-31: qty 20

## 2015-05-31 MED ORDER — ROCURONIUM BROMIDE 50 MG/5ML IV SOLN
INTRAVENOUS | Status: DC | PRN
Start: 2015-05-31 — End: 2015-05-31
  Administered 2015-05-31: 35 mg via INTRAVENOUS
  Administered 2015-05-31: 20 mg via INTRAVENOUS

## 2015-05-31 MED ORDER — ACETAMINOPHEN 325 MG PO TABS
325.0000 mg | ORAL_TABLET | ORAL | Status: DC | PRN
Start: 2015-05-31 — End: 2015-06-01

## 2015-05-31 MED ORDER — ONDANSETRON 4 MG PO TBDP
4.0000 mg | ORAL_TABLET | Freq: Three times a day (TID) | ORAL | Status: DC | PRN
Start: 2015-05-31 — End: 2015-06-02

## 2015-05-31 MED ORDER — DIPHENHYDRAMINE HCL 50 MG/ML IJ SOLN
12.5000 mg | Freq: Once | INTRAMUSCULAR | Status: DC | PRN
Start: 2015-05-31 — End: 2015-05-31

## 2015-05-31 MED ORDER — BUPIVACAINE-EPINEPHRINE (PF) 0.5% -1:200000 IJ SOLN
INTRAMUSCULAR | Status: DC | PRN
Start: 2015-05-31 — End: 2015-05-31
  Administered 2015-05-31: 30 mL

## 2015-05-31 MED ORDER — CEFAZOLIN 1 GM MBP (CNR)
Status: AC
Start: 2015-05-31 — End: ?
  Filled 2015-05-31: qty 200

## 2015-05-31 MED ORDER — MIDAZOLAM HCL 2 MG/2ML IJ SOLN
INTRAMUSCULAR | Status: AC
Start: 2015-05-31 — End: ?
  Filled 2015-05-31: qty 2

## 2015-05-31 MED ORDER — EPHEDRINE SULFATE 50 MG/ML IJ/IV SOLN (WRAP)
Status: AC
Start: 2015-05-31 — End: ?
  Filled 2015-05-31: qty 1

## 2015-05-31 MED ORDER — FENTANYL CITRATE (PF) 50 MCG/ML IJ SOLN (WRAP)
INTRAMUSCULAR | Status: DC | PRN
Start: 2015-05-31 — End: 2015-05-31
  Administered 2015-05-31: 25 ug via INTRAVENOUS
  Administered 2015-05-31: 100 ug via INTRAVENOUS
  Administered 2015-05-31 (×5): 25 ug via INTRAVENOUS

## 2015-05-31 MED ORDER — CEFAZOLIN SODIUM-DEXTROSE 2-3 GM-% IV SOLR
2.0000 g | Freq: Once | INTRAVENOUS | Status: AC
Start: 2015-05-31 — End: 2015-05-31
  Administered 2015-05-31: 2 g via INTRAVENOUS

## 2015-05-31 MED ORDER — LACTATED RINGERS IV SOLN
INTRAVENOUS | Status: DC
Start: 2015-05-31 — End: 2015-05-31

## 2015-05-31 MED ORDER — LIDOCAINE HCL 1 % IJ SOLN
INTRAMUSCULAR | Status: AC
Start: 2015-05-31 — End: ?
  Filled 2015-05-31: qty 10

## 2015-05-31 MED ORDER — HEPARIN SODIUM (PORCINE) 1000 UNIT/ML IJ SOLN
INTRAMUSCULAR | Status: AC
Start: 2015-05-31 — End: 2015-06-01
  Filled 2015-05-31: qty 6

## 2015-05-31 MED ORDER — MEPERIDINE HCL 25 MG/ML IJ SOLN
12.5000 mg | Freq: Once | INTRAMUSCULAR | Status: DC | PRN
Start: 2015-05-31 — End: 2015-05-31

## 2015-05-31 MED ORDER — ONDANSETRON HCL 4 MG/2ML IJ SOLN
INTRAMUSCULAR | Status: AC
Start: 2015-05-31 — End: ?
  Filled 2015-05-31: qty 2

## 2015-05-31 MED ORDER — CELECOXIB 200 MG PO CAPS
200.0000 mg | ORAL_CAPSULE | Freq: Once | ORAL | Status: AC
Start: 2015-05-31 — End: 2015-05-31
  Administered 2015-05-31: 200 mg via ORAL

## 2015-05-31 MED ORDER — MICROFIBRILLAR COLL HEMOSTAT EX POWD
CUTANEOUS | Status: DC | PRN
Start: 2015-05-31 — End: 2015-05-31
  Administered 2015-05-31: 1 g via TOPICAL

## 2015-05-31 MED ORDER — THROMBIN 5000 UNITS EX SOLR
CUTANEOUS | Status: DC | PRN
Start: 2015-05-31 — End: 2015-05-31
  Administered 2015-05-31: 5000 [IU] via TOPICAL
  Administered 2015-05-31: 10000 [IU] via TOPICAL

## 2015-05-31 MED ORDER — SENNOSIDES-DOCUSATE SODIUM 8.6-50 MG PO TABS
1.0000 | ORAL_TABLET | Freq: Two times a day (BID) | ORAL | Status: DC
Start: 2015-05-31 — End: 2015-06-02
  Administered 2015-05-31 – 2015-06-02 (×4): 1 via ORAL
  Filled 2015-05-31: qty 2
  Filled 2015-05-31 (×3): qty 1

## 2015-05-31 MED ORDER — ONDANSETRON HCL 4 MG/2ML IJ SOLN
4.0000 mg | Freq: Three times a day (TID) | INTRAMUSCULAR | Status: DC | PRN
Start: 2015-05-31 — End: 2015-06-02

## 2015-05-31 MED ORDER — DIPHENHYDRAMINE HCL 25 MG PO CAPS
25.0000 mg | ORAL_CAPSULE | Freq: Four times a day (QID) | ORAL | Status: DC | PRN
Start: 2015-05-31 — End: 2015-06-02

## 2015-05-31 MED ORDER — CYCLOBENZAPRINE HCL 10 MG PO TABS
10.0000 mg | ORAL_TABLET | Freq: Three times a day (TID) | ORAL | Status: DC | PRN
Start: 2015-05-31 — End: 2015-06-02

## 2015-05-31 MED ORDER — HYDROMORPHONE HCL 1 MG/ML IJ SOLN
0.5000 mg | INTRAMUSCULAR | Status: DC | PRN
Start: 2015-05-31 — End: 2015-05-31

## 2015-05-31 MED ORDER — GELATIN ABSORBABLE 100 EX MISC
CUTANEOUS | Status: DC | PRN
Start: 2015-05-31 — End: 2015-05-31
  Administered 2015-05-31: 1 via TOPICAL

## 2015-05-31 MED ORDER — PROPOFOL INFUSION 10 MG/ML
INTRAVENOUS | Status: DC | PRN
Start: 2015-05-31 — End: 2015-05-31
  Administered 2015-05-31: 200 mg via INTRAVENOUS

## 2015-05-31 MED ORDER — VALSARTAN 160 MG PO TABS
320.0000 mg | ORAL_TABLET | Freq: Every day | ORAL | Status: DC
Start: 2015-06-01 — End: 2015-06-02
  Administered 2015-06-02: 320 mg via ORAL
  Filled 2015-05-31: qty 2

## 2015-05-31 MED ORDER — SODIUM CHLORIDE 0.9 % IV BOLUS
INTRAVENOUS | Status: DC | PRN
Start: ? — End: 2015-05-31

## 2015-05-31 SURGICAL SUPPLY — 102 items
ANGIO CATH CODM (Procedure Accessories) ×2 IMPLANT
BAG DECANTER STERILE (Procedure Accessories) ×2 IMPLANT
BAG FOLEY DRAIN 16FR (Catheter Micellaneous) ×1
BAG FOLEY DRAIN 16FR (Catheter Miscellaneous) ×1 IMPLANT
BANDAGE ADH PLSTR POLYACRYLATE CVRL 2YD (Bandage) ×2
BANDAGE ADHESIVE L2 YD X W6 IN STRETCH (Bandage) ×1 IMPLANT
BANDAGE ADHESIVE L2 YD X W6 IN STRETCH NONWOVEN POROUS COVER-ROLL (Bandage) ×1 IMPLANT
BONE MASTERGRAFT VIAL 5CC (Bone) ×2 IMPLANT
CATH IV 14GX1.75IN NLTX (IV Supply) ×2 IMPLANT
CLOSURE STERI-STRIP 1X5IN (Dressing) ×2 IMPLANT
COTTONOID 1/2X1" 80-1402" (Dressing) IMPLANT
COVER HNDL LIGHT SINGLES (Procedure Accessories) ×4 IMPLANT
COVER HVYDTY BLK 65X90IN (Drape) ×2 IMPLANT
COVER STAND W23 IN REINFORCE PLASTIC (Drape) ×1 IMPLANT
COVER STND PLS MAYO CNVRT 23IN LF STRL (Drape) ×2 IMPLANT
COVERALL  PREM ANTI-ST SMS (Tape) ×2 IMPLANT
CTTND 1/2X1/2" (Sponge) IMPLANT
DRAIN INCS SIL FULL FLUT FLT 3/8IN LF (Drain) ×2
DRAIN RADIOPAQUE 4 FREE FLOW CHANNEL (Drain) ×1 IMPLANT
DRAIN RADIOPAQUE 4 FREE FLOW CHANNEL BARD L3/8 IN INCISION SILICONE (Drain) ×1 IMPLANT
DRAPE 3/4 SHEET FANFLD 52X76IN (Drape) ×2 IMPLANT
DRAPE LAPAROTOMY (Drape) ×2 IMPLANT
DRAPE MAG FM DVN 20X16IN LF STRL HNDFR (Drape) ×2
DRAPE MAGNETIC HAND FREE TRANSFER (Drape) ×1 IMPLANT
DRESSING FLEXZAN 4X4 (Dressing) ×2 IMPLANT
EVACUATOR RELIAVAC 100M (Drain) ×2 IMPLANT
FORCEPS ELECTROSURGICAL L7 3/4 IN 1.5 MM BAYONET BIPOLAR INSULATE CORD (Disposable Instruments) ×1 IMPLANT
FORCEPS ESURG SS 1.5MM BYNT LBRTY S-G (Disposable Instruments) ×2 IMPLANT
GLOVE SRG DERMASHIELD NPRN 7 GAMMEX LF (Glove) ×4
GLOVE SRG NTR RBR 8 INDCTR BGL 299X103MM (Glove) ×4
GLOVE SRGCL 7 GMMX CHEMOTHERAPY PWDR FREE ANTISLIP SMTH MICRO NPRN GRN (Glove) ×2 IMPLANT
GLOVE SURGICAL 7 GAMMEX CHEMOTHERAPY (Glove) ×2 IMPLANT
GLOVE SURGICAL 8 INDICATOR BIOGEL POWDER (Glove) ×2 IMPLANT
GLOVE SURGICAL 8 INDICATOR BIOGEL POWDER FREE SMOOTH BEAD CUFF (Glove) ×2 IMPLANT
GOWN OPTIMA STRL BACK OR (Gown) ×2 IMPLANT
GRAFT BN CLGN HA TCP 15CC THK6MM VENADO (Graft) ×2 IMPLANT
GRAFT BN CORT CANC 40CC 1-8MM RDGRF (Allograft) ×2 IMPLANT
GRAFT BONE CORTICAL CANCELLOUS 40 CC (Allograft) ×1 IMPLANT
GRAFT BONE CORTICAL CANCELLOUS 40 CC ALLOGRAFT CHIP READIGRAFT (Allograft) ×1 IMPLANT
GRAFT BONE RHBMP-2 BOVINE COLLAGEN L2 IN (Allograft) ×1 IMPLANT
GRAFT BONE RHBMP-2 BOVINE COLLAGEN L2 IN X W1 IN 5.6 ML ABSORBABLE (Allograft) ×1 IMPLANT
GRAFT BONE THK6MM 100X25MM COLLAGEN HA TCP 15CC VENADO STRIP (Graft) ×1 IMPLANT
KIT BNGF MED INFS (Allograft) ×2 IMPLANT
MARKER SKIN 2IN1 W/T-O SLEEVE (Procedure Accessories) ×2 IMPLANT
NEEDLE NOKOR FILTER 19GX1.5IN (Needles) ×2 IMPLANT
NEEDLE SPINAL BD OD22 GA L3 1/2 IN (Needles) IMPLANT
NEEDLE SPINAL L3 1/2 IN REGULAR WALL QUINCKE TIP OD22 GA BD (Needles) IMPLANT
NEEDLE SPNL PP RW BD QNCK 22GA 3.5IN LF (Needles)
PACK SPNE FFX (Pack) ×2 IMPLANT
PAD ABD DERMACEA 9X5IN LF STRL 4 SL EDG (Dressing) ×2
PAD ABD PVC CRTY 9X5IN LF STRL 3 LYR (Dressing) ×2 IMPLANT
PAD ABDOMINAL L9 IN X W5 IN 4 SEAL EDGE (Dressing) ×1 IMPLANT
PAD ARMBOARD 20X8X2IN (Procedure Accessories) ×6 IMPLANT
PAD ELECTROSRG GRND REM W CRD (Procedure Accessories) ×2 IMPLANT
PATTIES 1X3 USE LAWSON 2442 (Sponge) ×2 IMPLANT
POSITIONER HEAD SLOTTED ADLT (Positioning Supplies) ×2 IMPLANT
ROD SPINAL DENALI L35 MM OD5.5 MM (Rods) ×1 IMPLANT
ROD SPINAL DENALI L35 MM OD5.5 MM TITANIUM CONTOUR GRAY (Rods) ×1 IMPLANT
ROD SPINAL DENALI L40 MM OD5.5 MM (Rods) ×1 IMPLANT
ROD SPINAL DENALI L40 MM OD5.5 MM TITANIUM CONTOUR GRAY (Rods) ×1 IMPLANT
ROD SPNL TI CNTR DENALI 5.5MM 35MM NS (Rods) ×2 IMPLANT
ROD SPNL TI CNTR DENALI 5.5MM 40MM NS (Rods) ×2 IMPLANT
SCREW BN VRST 5.5MM 35MM NS PA SPNE (Screw) ×4 IMPLANT
SCREW BN VRST 7.5MM 45MM NS PA SPNE (Screw) ×4 IMPLANT
SCREW L35 MM OD5.5 MM SPINE POLYAXIAL (Screw) ×2 IMPLANT
SCREW L35 MM OD5.5 MM SPINE POLYAXIAL BONE EVEREST (Screw) ×2 IMPLANT
SCREW L45 MM OD7.5 MM SPINE POLYAXIAL (Screw) ×2 IMPLANT
SCREW L45 MM OD7.5 MM SPINE POLYAXIAL BONE EVEREST (Screw) ×2 IMPLANT
SCREW SET VRST NS SPNE (Screw) ×8 IMPLANT
SET SCREW LUMBAR ONE LEVEL EVEREST (Screw) ×4 IMPLANT
SLEEVE TED SCD LARGE (Procedure Accessories) ×2 IMPLANT
SOLUTION SRGPRP 74% ISPRP 0.7% IOD (Prep) ×2
SOLUTION SURGICAL PREP 26 ML DURAPREP (Prep) ×1 IMPLANT
SOLUTION SURGICAL PREP 26 ML DURAPREP 74% ISOPROPYL ALCOHOL 0.7% (Prep) ×1 IMPLANT
SPONGE GAUZE 12PLY STRL 4X4IN (Sponge) ×2 IMPLANT
SPONGE LAP 18X18IN PREWASH WHT (Sponge)
SPONGE LAPAROTOMY L18 IN X W18 IN (Sponge) IMPLANT
SPONGE LAPAROTOMY L18 IN X W18 IN PREWASH WHITE (Sponge) IMPLANT
SPONGE PEANUT C5 HOLDER DISSECTOR XRAY (Sponge) IMPLANT
SPONGE PEANUT C5 HOLDER DISSECTOR XRAY DETECTABLE OD3/8 IN DUKAL (Sponge) IMPLANT
SPONGE PEANUT RADPQE STRL3/8IN (Sponge)
SPONGE SRG VISTEC 8X4IN LF STRL 12 PLY (Sponge)
SPONGE SURGICAL L8 IN X W4 IN 12 PLY (Sponge) IMPLANT
SPONGE SURGICAL L8 IN X W4 IN 12 PLY RADIOPAQUE BAND VISTEC BLUE WHITE (Sponge) IMPLANT
SUBSTITUTE BONE GRAFT CALCIUM PHOSPHATE COLLAGEN 5 CC VIAL VOID FILLER (Bone) ×1 IMPLANT
SUTURE ABS 1 CT1 VCL 18IN CR BRD 8 STRN (Suture) ×4
SUTURE ABS 2-0 CT1 VCL 18IN CR BRD 8 (Suture) ×4
SUTURE ABS 3-0 PS1 VCL MTPS 27IN BRD (Suture) ×2
SUTURE COATED VICRYL 1 CT-1 L18 IN (Suture) ×2 IMPLANT
SUTURE COATED VICRYL 2-0 CT-1 18IN CNTRL BRD 8 STRND UNDYED (Suture) ×2 IMPLANT
SUTURE COATED VICRYL 2-0 CT-1 L18 IN (Suture) ×2 IMPLANT
SUTURE COATED VICRYL 3-0 PS-1 L27 IN (Suture) ×1 IMPLANT
SYRINGE LUER LOCK 10CC (Syringes, Needles) ×8 IMPLANT
SYSTEM IRR MTL VTVU YNKR 12FR LF STRL (Suction) ×2 IMPLANT
SYSTEM IRRIGATION EXTEND SUCTION ILLUMINATION TUBE BULB TIP VITAL-VUE (Suction) ×1 IMPLANT
TOOL DISSECTING L14 CM MATCH HEAD FLUTE (Burr) ×1 IMPLANT
TOOL DISSECTING L14 CM MATCH HEAD FLUTE OD3 MM MIDAS REX LEGEND (Burr) ×1 IMPLANT
TOOL DSCT LGND 3MM 14MM (Burr) ×2
TOWEL L26 IN X W17 IN COTTON PREWASH DELINT BLUE ACTISORB DELUXE (Procedure Accessories) IMPLANT
TOWEL SRG CTTN 26X17IN LF STRL PREWASH (Procedure Accessories) IMPLANT
TOWEL STERILE REUSABLE 8PK (Procedure Accessories) ×2 IMPLANT
TUBING SUCTION 3/16X10 (Tubing) ×4 IMPLANT

## 2015-05-31 NOTE — Anesthesia Postprocedure Evaluation (Signed)
Anesthesia Post Evaluation    Patient: Jesus Bowman    Procedure(s) with comments:  LAMINECTOMY, POSTERIOR LUMBAR, DECOMP, FUSION, LEVEL 2 - L3-L5 LAMINECTOMY, L4-L5 FUSION WITH INFUSE    Anesthesia type: general    Last Vitals:   Filed Vitals:    05/31/15 2150   BP: 137/70   Pulse: 77   Temp: 36.5 C (97.7 F)   Resp: 16   SpO2: 96%       Patient Location: Phase I PACU      Post Pain: Patient not complaining of pain, continue current therapy    Mental Status: awake and alert    Respiratory Function: tolerating nasal cannula    Cardiovascular: stable    Nausea/Vomiting: patient not complaining of nausea or vomiting    Hydration Status: adequate    Post Assessment: no apparent anesthetic complications, no reportable events and no evidence of recall               Davina Poke, 05/31/2015 9:55 PM

## 2015-05-31 NOTE — Transfer of Care (Signed)
Anesthesia Transfer of Care Note    Patient: Jesus Bowman    Procedures performed: Procedure(s) with comments:  LAMINECTOMY, POSTERIOR LUMBAR, DECOMP, FUSION, LEVEL 2 - L3-L5 LAMINECTOMY, L4-L5 FUSION WITH INFUSE    Anesthesia type: General ETT    Patient location:PACU    Last vitals:   Filed Vitals:    05/31/15 2051   BP: 133/66   Pulse: 77   Temp: 36.5 C (97.7 F)   Resp: 16   SpO2: 100%       Post pain: Patient not complaining of pain, continue current therapy      Mental Status:awake and alert     Respiratory Function: tolerating face mask    Cardiovascular: stable    Nausea/Vomiting: patient not complaining of nausea or vomiting    Hydration Status: adequate    Post assessment: no apparent anesthetic complications and no reportable events    Signed by: Karlton Lemon Shaniqwa Horsman  05/31/2015 8:51 PM

## 2015-05-31 NOTE — Anesthesia Preprocedure Evaluation (Signed)
Anesthesia Evaluation    AIRWAY    Mallampati: III    TM distance: >3 FB  Neck ROM: limited  Mouth Opening:full   CARDIOVASCULAR    regular and normal       DENTAL    no notable dental hx     PULMONARY    clear to auscultation     OTHER FINDINGS              PSS Anesthesia Comments:   RLE, pain, weakness, numbness  HTN  Elevated BMI  GERD        Anesthesia Plan    ASA 3     general                                 informed consent obtained

## 2015-05-31 NOTE — Anesthesia Postprocedure Evaluation (Signed)
Anesthesia Post Evaluation    Patient: Jesus Bowman    Procedure(s) with comments:  LAMINECTOMY, POSTERIOR LUMBAR, DECOMP, FUSION, LEVEL 2 - L3-L5 LAMINECTOMY, L4-L5 FUSION WITH INFUSE    Anesthesia type: general    Last Vitals:   Filed Vitals:    05/31/15 2150   BP: 137/70   Pulse: 77   Temp: 36.5 C (97.7 F)   Resp: 16   SpO2: 96%            Anesthesia Qualified Clinical Data Registry    Central Line      CVC insertion : NO                                               Perioperative temperature management      General/neuraxial anesthesia > or = 60 minutes (excluding CABG) : YES              > Use of intraoperative active warming : YES              > Temperature > or = 36 degrees Centigrade (96.8 degrees Farenheit) during time span from 30 minutes before up to 15 minutes after anesthesia end time : NO      Administration of antibiotic prophylaxis      Age > or = 18, with IV access, with surgical procedure for which antibiotic prophylaxis indicated, and not on chronic antibiotics : YES              > Prophylactic antibiotics within 1 hour of incision (or fluroroquinolone/vancomycin within 2 hours of incision) : YES    Medication Administration      Ordering or administration of drug inconsistent with intended drug, dose, delivery or timing : NO      Dental loss/damage      Dental injury with administration of anesthesia : NO      Difficult intubation due to unrecognized difficult airway        Elective airway procedure including but not limited to: tracheostomy, fiberoptic bronchoscopy, rigid bronchoscopy; jet ventilation; or elective use of a device to facilitate airway management such as a Glidescope : YES              > Unanticipated difficult intubation post pre-evaluation : NO      Aspiration of gastric contents        Aspiration of gastric contents : NO                    Surgical fire        Procedure requiring electrocautery/laser : NO                    Immediate perioperative cardiac arrest         Cardiac arrest in OR or PACU : NO                    Unplanned hospital admission        Unplanned hospital admission for initially intended outpatient anesthesia service : NO      Unplanned ICU admission        Unplanned ICU admission related to anesthesia occurring within 24 hours of induction or start of MAC : NO      Surgical case cancellation        Cancellation of procedure  after care already started by anesthesia care team : NO      Post-anesthesia transfer of care checklist/protocol to PACU        Transfer from OR to PACU upon case conclusion : YES              > Use of PACU transfer checklist/protocol : YES     (Includes the key elements of: patient identification, responsible practitioner identification (PACU nurse or advanced practitioner), discussion of pertinent history and procedure course, intraoperative anesthetic management, post-procedure plans, acknowledgement/questions)    Post-anesthesia transfer of care checklist/protocol to ICU        Transfer from OR to ICU upon case conclusion : NO                    Post-operative nausea/vomiting risk protocol        Post-operative nausea/vomiting risk protocol : YES  Patient > or = 18 with care initiated by anesthesia team that has a risk factor screen for post-op nausea/vomiting (Includes male, hx PONV, or motion sickness, non-smoker, intended opioid administration for post-op analgesia.)    Anaphylaxis        Anaphylaxis during anesthesia services : NO    (Inclusive of any suspected transfusion reaction in association with blood-bank confirmed blood product incompatibility)              Kamy Poinsett, 05/31/2015 9:55 PM

## 2015-05-31 NOTE — H&P (Signed)
History and physical reviewed and patient re-examined today.  No changes noted.

## 2015-06-01 ENCOUNTER — Encounter: Payer: Self-pay | Admitting: Orthopaedic Surgery

## 2015-06-01 LAB — CBC
Hematocrit: 38.8 % — ABNORMAL LOW (ref 42.0–52.0)
Hgb: 12.4 g/dL — ABNORMAL LOW (ref 13.0–17.0)
MCH: 29.7 pg (ref 28.0–32.0)
MCHC: 32 g/dL (ref 32.0–36.0)
MCV: 93 fL (ref 80.0–100.0)
MPV: 10.4 fL (ref 9.4–12.3)
Nucleated RBC: 0 /100 WBC (ref 0–1)
Platelets: 232 10*3/uL (ref 140–400)
RBC: 4.17 10*6/uL — ABNORMAL LOW (ref 4.70–6.00)
RDW: 13 % (ref 12–15)
WBC: 8.45 10*3/uL (ref 3.50–10.80)

## 2015-06-01 LAB — BASIC METABOLIC PANEL
BUN: 16 mg/dL (ref 9.0–28.0)
CO2: 21 mEq/L (ref 21–30)
Calcium: 9 mg/dL (ref 8.5–10.5)
Chloride: 103 mEq/L (ref 100–111)
Creatinine: 1.3 mg/dL (ref 0.5–1.5)
Glucose: 169 mg/dL — ABNORMAL HIGH (ref 70–100)
Potassium: 4.6 mEq/L (ref 3.5–5.3)
Sodium: 136 mEq/L (ref 135–146)

## 2015-06-01 LAB — HEMOLYSIS INDEX: Hemolysis Index: 16 (ref 0–18)

## 2015-06-01 LAB — GFR: EGFR: >60

## 2015-06-01 MED ORDER — OXYCODONE HCL 5 MG PO TABS
5.0000 mg | ORAL_TABLET | ORAL | Status: DC | PRN
Start: 2015-06-01 — End: 2015-06-02

## 2015-06-01 MED ORDER — OXYCODONE HCL 5 MG PO TABS
15.0000 mg | ORAL_TABLET | ORAL | Status: DC | PRN
Start: 2015-06-01 — End: 2015-06-02

## 2015-06-01 MED ORDER — PSYLLIUM 100 % PO PACK
1.0000 | PACK | Freq: Every day | ORAL | Status: DC | PRN
Start: 2015-06-01 — End: 2015-06-02

## 2015-06-01 MED ORDER — ACETAMINOPHEN 325 MG PO TABS
650.0000 mg | ORAL_TABLET | ORAL | Status: DC | PRN
Start: 2015-06-01 — End: 2015-06-02

## 2015-06-01 MED ORDER — POLYETHYLENE GLYCOL 3350 17 G PO PACK
17.0000 g | PACK | Freq: Every day | ORAL | Status: DC | PRN
Start: 2015-06-01 — End: 2015-06-02
  Administered 2015-06-01: 17 g via ORAL
  Filled 2015-06-01: qty 1

## 2015-06-01 NOTE — UM Notes (Signed)
Admit 05/31/15 for planned LAMINECTOMY, POSTERIOR LUMBAR, DECOMP, FUSION, LEVEL 2 - L3-L5 LAMINECTOMY, L4-L5 FUSION WITH INFUSE      Care Date #1  05/31/15  Admit inpt status  Date and time of inpt order: 05/31/15 2226    OR for LAMINECTOMY, POSTERIOR LUMBAR, DECOMP, FUSION, LEVEL 2 - L3-L5 LAMINECTOMY, L4-L5 FUSION WITH INFUSE    PT/OT, labs, zofran po/iv, IVF, neurovasc checks, ambulate, dilaudid IV, cefazolin IV x 2, oxycodone, flexeril, scd, home meds,     Pain 3/10 on po meds    Foley    JP drain 150 cc    Leafy Half RN BSN  Utilization Review Case Manager  [employer]  [phone]

## 2015-06-01 NOTE — Progress Notes (Addendum)
PROGRESS NOTE    Date Time: 06/01/2015 9:50 AM  Patient Name: Jesus Bowman  Attending Physician: Clelia Schaumann, MD    Assessment:   Stable.    Plan:   PT/OT for ambulation and mobilization  Subjective:   Expected incisional pain.  Ambulating to chair.  Dressing clean, dry and intact.  Drain 150cc.  Denies leg pain.        Medications:     Current Facility-Administered Medications   Medication Dose Route Frequency   . pregabalin  100 mg Oral BID   . rosuvastatin  10 mg Oral QHS   . senna-docusate  1 tablet Oral BID   . valsartan  320 mg Oral Daily       Physical Exam:     Filed Vitals:    06/01/15 0714   BP: 93/44   Pulse: 75   Temp: 97.6 F (36.4 C)   Resp: 18   SpO2: 95%         Intake/Output Summary (Last 24 hours) at 06/01/15 0950  Last data filed at 06/01/15 0911   Gross per 24 hour   Intake   2230 ml   Output   1710 ml   Net    520 ml       Drains:     Closed/Suction Drain Posterior Bulb 10 Fr. (Active)   Number of days:1632       Closed/Suction Drain Back 10 Fr. (Active)   Site Description Unable to view 05/31/2015 11:16 PM   Dressing Status Clean;Dry;Intact 05/31/2015 11:16 PM   Drainage Appearance Bloody 05/31/2015 11:16 PM   Status To bulb suction 05/31/2015 11:16 PM   Output (mL) 40 mL 06/01/2015  3:53 AM   Number of days:1       [REMOVED] Urethral Catheter Latex 16 Fr. (Removed)   Removed 06/01/15 0541   Catheter necessity reviewed? Yes 05/31/2015 11:16 PM   Site Assessment Clean;Intact 05/31/2015 11:16 PM   Collection Container Standard drainage bag 05/31/2015 11:16 PM   Securement Method Stat lock 05/31/2015 11:16 PM   Reason for Continuing Urinary Catheterization past POD 1 Immobilization/bedrest for trauma or surgery due to potentially unstable spine 05/31/2015 11:16 PM   Positioned catheter tubing for unobstructed urine flow: Yes 05/31/2015 11:16 PM   Output (mL) 150 mL 06/01/2015  5:40 AM   Number of days:1         Labs:     Lab Results   Component Value Date    WBC 8.45 06/01/2015    HGB 12.4*  06/01/2015    HCT 38.8* 06/01/2015    MCV 93.0 06/01/2015    PLT 232 06/01/2015               Signed by: Dewaine Oats, C-ANP  Date/Time: 06/01/2015 9:50 AM

## 2015-06-01 NOTE — Plan of Care (Signed)
Problem: Day 1 Post-op- Lumbar Discectomy/Laminectomy/Fusion  Goal: Free from Infection  Outcome: Progressing  Goal: Hemodynamic Stability  Outcome: Progressing  Goal: Mobility/activity is maintained at optimum level for patient  Outcome: Progressing  Goal: Pain at adequate level as identified by patient  Outcome: Progressing  Goal: Patient will maintain normal GI status  Outcome: Progressing  Goal: Patient/Patient Care Companion demonstrates understanding of disease process, treatment plan, medications, and discharge plan  Outcome: Progressing  Goal: Stable Neurovascular Status  Outcome: Progressing    Comments:   Alert and oriented x 4. Pain well under control with oxycodone po prn. Voiding well. Encouraged to use intensive spirometry. Ambulating in room. Call bell and bed side table within reach.

## 2015-06-01 NOTE — Plan of Care (Signed)
Problem: Day of Surgery- Lumbar Discectomy/Laminectomy/Fusion  Goal: Free from Infection  Outcome: Progressing  VSS, afebrile. Surgical dressing to the back, c/d/i. Encouraged to use IS. Foley cath intact and patent, draining clear yellow urine. JP drain intact, bloody drain.   Goal: Mobility/activity is maintained at optimum level for patient  Outcome: Progressing  Pt dangled, and stood at bedside with walker. Denies dizzinedd, sob, or chest pain. Spine precautions maintained.   Goal: Pain at adequate level as identified by patient  Outcome: Progressing  PRN meds givens. No acute distress noted. Will continue to assess.   Goal: Patient will maintain normal GI status  Outcome: Progressing  No N/V. Tolerating po intake well. Active bowel sounds. Will continue to monitor.   Goal: Patient/Patient Care Companion demonstrates understanding on disease process, treatment plan, medications and discharge plan  Outcome: Progressing  Goal: Stable Neurovascular Status  Outcome: Progressing  Good capillary refill and pedal pulses noted. Reinforced ankle pumps exercises. Will continue to monitor.     Comments:   Pt received from pacu, a&ox4. No acute distress. Pt noted with edema to the lip, ice pack applied, got better. Oriented to the room and call light. Review the daily pathway. Safety precautions maintained. Will continue to monitor.

## 2015-06-01 NOTE — Op Note (Signed)
Procedure Date: 05/31/2015     Patient Type: I     SURGEON: Clelia Schaumann MD  ASSISTANT:       FIRST ASSISTANT:  Vivien Rota, SA.     SECOND ASSISTANT:  Ree Edman, PA.     PREOPERATIVE DIAGNOSES:  1.  Lumbar radiculopathy with right-sided radiculopathy, L4-L5.  2.  Pseudoarthrosis, L4-L5.  3.  Status post L4-L5 fusion.  4.  Degenerative spondylolisthesis.     POSTOPERATIVE DIAGNOSES:  1.  Lumbar radiculopathy with right-sided radiculopathy, L4-L5.  2.  Pseudoarthrosis, L4-L5.  3.  Status post L4-L5 fusion.  4.  Degenerative spondylolisthesis.     TITLES OF PROCEDURE:  1.  Revision laminectomy, L4 and L5- Right.  2.  Revision fusion, L4-L5.  3.  Spinal instrumentation, L4-L5 (K2 Everest system).  4.  Bone marrow aspiration.  5.  Bone grafting (local laminar fragments, cortical cancellous allograft,  Infuse).     ANESTHESIA:  General.     COMPLICATIONS:  None.     DRAINS:  One Jackson-Pratt to the subfascial space.     INDICATIONS FOR PROCEDURE:  The patient is a 60 year old gentleman with persistent back and right leg  pain.  He has previously undergone laminectomy and fusion L4-L5,  complicated by pseudarthrosis.  Patient presents now for revision  decompression and fusion due to incapacitating symptoms and the failure of  nonoperative measures to alleviate his symptoms.  Preoperatively, he was  counseled regarding indications for procedure and potential complications.   Additionally, the patient was advised as to the plan to use of Infuse to  enhance the chance of successful union.  The FDA status of Infuse with  regard to lumbar fusion, the potential indications, and complications  associated with its use in lumbar spinal surgery were discussed  preoperatively.  Finally, the patient's case was complicated by the  presence of broken pedicle screws, which remained in the pedicle and  vertebral body bilaterally at L4.     DESCRIPTION OF PROCEDURE:  The patient received prophylactic antibiotics in the holding  area.  He was  brought to the operating room.  After the induction of anesthesia, a Foley  catheter was placed.  The patient was hooked up for neurological  monitoring.  Sequential compression devices were applied to both legs.  He  was then carefully placed in the prone position on Wilson frame.  Care was  taken to ensure his abdomen was free of compression and all bony  prominences were well padded.  The back was prepped and draped in usual  fashion.  A vertical midline incision was made, dissection carried sharply  through the skin and subcutaneous tissue.  The paraspinal musculature was  carefully and meticulously mobilized out to the lateral border of the tips  of the transverse processes from L3 to L5.  Excellent exposure was  obtained.  The intertransverse region at L4-L5 was well exposed and cleared  of overlying scar tissue and the bony elements dorsal to the  intertransverse ligament were well exposed.  Attention was directed to the  right side, which is the patient's severe leg pain.  The scar tissue was  mobilized to the medial border of the previous laminectomy site.  Once this  was determined, the edge was determined, a curette was used to mobilize the  scarred up dura from the superior aspect of L5 lamina, the medial border of  the facet joint and the lamina, and this was done from the L4 pedicle to  the L5 pedicle.  Then  laminectomy was performed further at L5 and  aggressive decompression was performed directly over the L5 nerve root.   The medial border of the pedicle of L5 could easily be palpated.  At the  completion of this decompression, a Woodson elevator could follow the L5  nerve root freely and could follow the L4 nerve root, freely out to the  foramen as well.  The bone taken during this decompression was meticulously  cleared of soft tissue and morselized for use in the fusion.  A trocar was  introduced into the iliac crest and 50 mL of bone marrow was aspirated in  standard fashion.   This was then spun down and concentrated, and used to  saturate an osteoconductive sponge to be mixed with the bone graft.  At  this junction, beginning on the right the pedicles of L4 and L5 were  entered with a Midas Rex, hand probed to 35 mm at L4 and L5 was tapped and  the preexisting hole was located.  The holes were palpated with pedicle  feeler and gave evidence of good bony contact in all planes anteriorly,  medially, laterally, and superiorly.  At L4, a superior trajectory was  taken to try to avoid the prior remaining broken screw in the pedicle.  We  were able to successfully do this with a slightly more superior and lateral  position.  A 5.5 x 35 mm screw was placed, achieving excellent purchase.   It was stimulated with no evidence of nerve root irritation.  AP and  lateral fluoroscopic views were satisfactory.  A 7.5 x 45-mm screw was  placed at L5 and achieved excellent purchase.  It was stimulated with no  evidence of nerve root irritation.  On the left side, screws were inserted  at L4, L5, in a similar fashion, and the exact same sizes.  All 4 screws  were stimulated with no evidence of nerve root irritation.  AP and lateral  fluoroscopic views were satisfactory.  A meticulous decortication was then  performed at transverse processes bilaterally of L4 and L5, the pars  interarticularis of L4 and L5, the facet joints L4-L5.  All were  decorticated.  There was too much scarring to mobilize to expose the  lateral regions to attempt to place a cage from the posterior aspect, but  we had excellent purchase and stability posteriorly with this construct.   The wound was copiously irrigated.  The bone graft materials were placed  into the intertransverse region.  The dura was protected with patties while  this was performed.  The patties were removed.  The canal was checked.   There was no evidence of any bone graft migration into the canal.  The  exposed dura was covered with Avitene slurry, then Gelfoam.   A  Jackson-Pratt drain was placed in the subfascial space.  The wound was then  closed at the level of fascia with #1 Vicryl in a figure-of-eight stitch.   Subcutaneous tissue was repaired with 2-0 Vicryl.  Skin was closed in a  subcuticular fashion.  A sterile dressing was applied.  The patient was  transferred to his bed and then to recovery room without incident.           D:  05/31/2015 21:55 PM by Dr. Edman Circle. Marjo Bicker, MD [phone])  T:  06/01/2015 11:51 AM by       Everlean Cherry: [id number]) (Doc ID: [id number])

## 2015-06-01 NOTE — OT Eval Note (Signed)
[facility]   Occupational Therapy Evaluation     Patient: Jesus Bowman    MRN#: [id number]   Unit: [facility] TOWER 8  Bed: V409/W119.14                                     Discharge Recommendations:   Discharge Recommendation: Home with supervision (and assistance from family for higher level adls)   DME Recommended for Discharge:  (in place)        Assessment:   Jesus Bowman is a 60 y.o. male admitted 05/31/2015. Patient presents with minimally decreased adl I, Functional mobility, activity tolerance and endurance. Patient will benefit from contd OT to maximize adls.    Impairments: Assessment: decreased independence with ADLs    Therapy Diagnosis: decreased adl I    Rehabilitation Potential: Prognosis: Good;With continued OT s/p acute discharge     Treatment Activities: OT EVAL, adl re training    Educated the patient to role of occupational therapy, plan of care, goals of therapy and HEP, safety with mobility and ADLs.    Plan:   OT Frequency Recommended: 3-4x/wk     Treatment Interventions: ADL retraining;Functional transfer training;Patient/Family training;Equipment eval/education     Risks/benefits/POC discussed with patient         Precautions and Contraindications:   Precautions  Weight Bearing Status: no restrictions  Precaution Instructions Given to Patient: Yes  Spinal Precautions: no bending, no twisting, no lifting  Other Precautions: falls      Consult received for Cheri Rous Bowman for OT Evaluation and Treatment.  Patient's medical condition is appropriate for Occupational Therapy intervention at this time.    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 male admitted on 05/31/2015 with   DIAGNOSES:  1. Lumbar radiculopathy with right-sided radiculopathy, L4-L5.  2. Pseudoarthrosis, L4-L5.  3. Status post L4-L5 fusion.  4. Degenerative  spondylolisthesis.    POSTOPERATIVE DIAGNOSES:  1. Lumbar radiculopathy with right-sided radiculopathy, L4-L5.  2. Pseudoarthrosis, L4-L5.  3. Status post L4-L5 fusion.  4. Degenerative spondylolisthesis.    TITLES OF PROCEDURE:  1. Revision laminectomy, L4-L5 on the right.  2. Revision fusion, L4-L5.  3. Spinal instrumentation, L4-L5 (K2 Everest system).  4. Bone marrow aspiration.  5. Bone grafting (local laminar fragments, cortical cancellous allograft,  Infuse).      Past Medical/Surgical History:  Past Medical History   Diagnosis Date   . Snoring      no sleep study   . GERD (gastroesophageal reflux disease)    . Low back pain    . Spinal stenosis    . Hyperlipidemia      medically managed   . Hypertension      medically managed   . Neuropathy      BL Legs   . Tinnitus    . Rash      chronic groin rash    . Osteoarthrosis      Per [facility] November 09, 2014  Dr. Milagros Evener    . Congenital mitral insufficiency      Per [facility]  November 09, 2014, pt denies   . Calculus of kidney 2007     One episode   . Urinary incontinence  Per [facility]  November 09, 2014  Dr. Milagros Evener    . Difficulty in walking(719.7)      stiffness-uses cane occasionally   . Abnormal vision      glasses     Past Surgical History   Procedure Laterality Date   . Carpal tunnel release Bilateral 2003, 2008     left 2008, right 2003   . Colonoscopy  09/2010     multiple   . Joint replacement Left 2010     left hip   . Spine surgery  2006,2011,2012     laminectomy 2006 & 2011, fusion 2012   . Eye surgery Left March 30, 2013      Laser to repair torn retina    . Removal, posterior lumbar spine hardware N/A 11/18/2014     Procedure: REMOVAL, POSTERIOR LUMBAR SPINE HARDWARE;  Surgeon: Clelia Schaumann, MD;  Location: [facility];  Service: Orthopedics;  Laterality: N/A;  L4-L5 REMOVAL OF HARDWARE   . Laminectomy, posterior lumbar, decomp, fusion, level 2 N/A 05/31/2015     Procedure: LAMINECTOMY, POSTERIOR LUMBAR, DECOMP, FUSION, LEVEL 2;   Surgeon: Clelia Schaumann, MD;  Location: [facility];  Service: Orthopedics;  Laterality: N/A;  L3-L5 LAMINECTOMY, L4-L5 FUSION WITH INFUSE       Imaging/Tests/Labs:  Fluoroscopy Greater Than 1 Hour    05/31/2015    Fluoroscopic guidance  Jesus Burdock, MD 05/31/2015 7:54 PM       Social History:   Prior Level of Function:  Prior level of function: Independent with ADLs, Ambulates independently  Baseline Activity Level: Community ambulation  Driving: independent  DME Currently at Home: Single point cane, Front wheel walker    Home Living Arrangements:  Living Arrangements: Spouse/significant other  Type of Home: House  Bathroom Shower/Tub: Engineer, mining Toilet: Raised  DME Currently at Home: Single point cane, Front wheel walker      Subjective:I feel better.Marland Kitchen i have no pain     Patient is agreeable to participation in the therapy session.     Pain:none stated    Patient goal- home               Objective:        Observation of Patient/Vital Signs:  Patient is in bed with dressings, SCD's and peripheral IV, JP Drain in place.    Cognitive Status and Neuro Exam:          intact       Musculoskeletal Examination  Gross ROM  Right Upper Extremity ROM: within functional limits  Left Upper Extremity ROM: within functional limits    Gross Strength  Right Upper Extremity Strength: within functional limits  Left Upper Extremity Strength: within functional limits              Sensory/Oculomotor Examination        touch- intact  Vision- intact    Activities of Daily Living  Self-care and Home Management  Eating: Setup  Grooming: setup  Bathing: Moderate Assist;Minimal Assist  UB Dressing: set up  LB Dressing: Minimal Assist  Toileting: Contact Guard Assist  Functional Transfers: Contact Guard Assist;Minimal Assist    Functional Mobility:  Mobility and Transfers  Supine to Sit: Contact Guard Assist  Sit to Supine: Contact Guard Assist  Sit to Stand: Contact Guard Assist  Bed to Chair: Energy manager Sitting Balance: good  Dyanamic Sitting Balance: fair  Static Standing Balance: fair    Participation and Activity Tolerance  Participation and Endurance  Participation Effort: good  Endurance: Tolerates 10 - 20 min exercise with multiple rests    Patient left with call bell within reach, all needs met, SCDs on, fall mat on, bed alarm offo, chair alarm off and all questions answered. RN notified of session outcome and patient response.       Goals:  Time For Goal Achievement: 3 visits  ADL Goals  Patient will dress lower body: Supervision  Patient will toilet: Supervision  Mobility and Transfer Goals  Pt will perform functional transfers: Supervision                                Time of treatment:   OT Received On: 06/01/15  Start Time: 0830  Stop Time: 0905  Time Calculation (min): 35 min    Tora Perches OTR/L  Pager [phone]

## 2015-06-01 NOTE — PT Eval Note (Addendum)
[facility]   Physical Therapy Evaluation   Patient: Jesus Bowman     MRN#: [id number]  Unit: [facility] TOWER 8 Bed: V409/W119.14    Discharge Recommendations:   Discharge Recommendation: Home with no needs   DME Recommendation: No DME needs, DME in place      Recommendations can change; please see most recent physical therapy treatment note for updates.      Assessment:   Jesus Bowman is a 60 y.o. male admitted 05/31/2015 with s/p "LAMINECTOMY, POSTERIOR LUMBAR, DECOMP, FUSION, LEVEL 2 - L3-L5 LAMINECTOMY, L4-L5 FUSION WITH INFUSE", POD #1. Pt received supine in bed. Pt was instructed in log roll technique, educated in spinal precautions - pt verbalizes understanding. Pt able to participate in x200 ft of gait with SBA at RW vs. CGA with SPC. Pt prefers to use SPC but has improved stability/mobility with RW. Pt able to ascend/descend x10 steps mod (I). Pt presents with decreased strength and impaired static/dynamic balance.Pt is an appropriate candidate for skilled PT, and would benefit from initiation of treatment to maximize level of independence and improve safety with mobility tasks.     Therapy Diagnosis: Gait Impairment    Rehabilitation Potential/Prognosis: good for stated goals    Treatment Activities: Evaluation, Gait Training    Educated the patient to role of physical therapy, plan of care, goals of therapy and safety with mobility and ADLs, home safety and spine precautions.    Plan:   PT Frequency: 3-4x/wk    Treatment/Interventions: Gait training, transfers, bed mobility, therapeutic activities, therapeutic exercises, neuromuscular re-education, functional mobility, endurance/activity tolerance training, patient education and safety     Risks/benefits/POC discussed with pt      Continue plan of care.       Precautions and Contraindications:   Spine precautions  Falls    Consult received for Jesus Bowman for PT Evaluation and Treatment.  Patient's  medical condition is appropriate for Physical Therapy intervention at this time.    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 male admitted on 05/31/2015 with s/p "LAMINECTOMY, POSTERIOR LUMBAR, DECOMP, FUSION, LEVEL 2 - L3-L5 LAMINECTOMY, L4-L5 FUSION WITH INFUSE", POD #1.    Past Medical/Surgical History:  PMH:    . Low back pain    . Spinal stenosis    . Hyperlipidemia      medically managed   . Hypertension      medically managed   . Neuropathy      BL Legs   . Tinnitus    . Osteoarthrosis      Per [facility] November 09, 2014  Dr. Milagros Evener    . Congenital mitral insufficiency      Per [facility]  November 09, 2014, pt denies   . Urinary incontinence      Per [facility]  November 09, 2014  Dr. Milagros Evener    . Difficulty in walking(719.7)      stiffness-uses cane occasionally   . Abnormal vision      glasses     PSH:   . Carpal tunnel release Bilateral 2003, 2008     left 2008, right 2003   . Joint replacement Left 2010     left hip   . Spine surgery  2006,2011,2012     laminectomy 2006 & 2011, fusion 2012   .  Eye surgery Left March 30, 2013      Laser to repair torn retina    . Removal, posterior lumbar spine hardware N/A 11/18/2014     Procedure: REMOVAL, POSTERIOR LUMBAR SPINE HARDWARE;  Surgeon: Clelia Schaumann, MD;  Location: [facility];  Service: Orthopedics;  Laterality: N/A;  L4-L5 REMOVAL OF HARDWARE   . Laminectomy, posterior lumbar, decomp, fusion, level 2 N/A 05/31/2015     Procedure: LAMINECTOMY, POSTERIOR LUMBAR, DECOMP, FUSION, LEVEL 2;  Surgeon: Clelia Schaumann, MD;  Location: [place] [facility];  Service: Orthopedics;  Laterality: N/A;  L3-L5 LAMINECTOMY, L4-L5 FUSION WITH INFUSE     Imaging/Tests/Labs:   Lab Results   Component Value Date/Time    HGB 12.4* 06/01/2015 03:55 AM    HEMATOCRIT 38.8* 06/01/2015 03:55 AM    POTASSIUM 4.6 06/01/2015 03:56 AM    SODIUM 136 06/01/2015 03:56 AM     Social History:   Prior  Level of Function: Mod I  Assistive Devices: SPC  Baseline Activity: Community ambulation (walks dogs)  DME Currently at Home: RW x2, SPC, Paediatric nurse, Primary school teacher bars  Home Living Arrangements: With spouse  Type of Home: House  Home Layout: Multi Level - x10 steps to enter with railing on L, x10 steps to bedroom/bathroom with railing  Other: Pts spouse will be at home during the day, able to assist PRN.    Subjective:   Patient is agreeable to participation in the therapy session. Nursing clears patient for therapy.     Pt asking "Do you think I will be okay to walk my dogs when I go home?"    Patient Goal: To go home    Pain:   Scale: 2/10  Location: R foot  Intervention: Pre-medicated by RN prior to initiation of PT tx session    Objective:   Patient is in bed with SCD's, floor mats, and bed alarm in place.     Informed patient of the risks and benefits of participating in PT.    Vital Signs:  Vital signs stable pre/post tx.     Cognitive Status and Neuro Exam:  Arousal/Alertness: Appropriate responses to stimuli  Attention Span: Appears intact  Memory: Appears intact  Following Commands: Follows all commands and directions without difficulty  Safety Awareness: minimal verbal instruction  Problem Solving: Able to problem solve independently    Patient is alert and oriented x 4; pleasant and cooperative.     Musculoskeletal Examination  Gross ROM:  RUE: WFL  LUE: WFL  RLE: WFL   LLE: WFL     Gross Strength:  RUE: 4+/5  LUE: 4+/5  RLE: 4+/5 ankle dorsiflexion & knee extension; 3/5 hip flexion  LLE: 4+/5 ankle dorsiflexion & knee extension; 4-/5 hip flexion    Functional Mobility  Rolling: Ind  Supine to Sit: CGA for log roll  Sit to Supine: CGA for log roll  Scooting: Ind  Sit to Stand: Mod I with RW  Stand to Sit: Mod I with RW  Transfers: Mod I with RW    Ambulation  Weightbearing Status: No restrictions  Device Used: RW vs. SPC  Level of assistance required: SBA with RW, CGA with SPC  Ambulation Distance: x200 ft  with RW, x20 ft with SPC  Pattern: Decreased cadence, decreased step length  Stair Management: x10 steps ascend/descend mod (I) with single railing use     Balance  Static Sitting: good  Dynamic Sitting: good  Static Standing: good  Dynamic Standing: fair+  Participation Effort: Good  Endurance: Endurance does not limit participation in activity at this time    Pt left supine in bed (defers OOB to chair d/t just BTB prior to PT session) with needs met with RN informed of status as well as outcome of PT session.    Patient left with call bell and phone within reach, SCD's on, bed alarm,  fall mats in place, all needs met and all questions answered.    Goals:    Goals  Goal Formulation: With patient  Time for Goal Acheivement: 2 visits  Goals: Select goal  Pt Will Go Supine To Sit: modified independent, to maximize functional mobility and independence  Pt Will Ambulate: > 200 feet, with rolling walker, to maximize functional mobility and independence, modified independent  Pt Will Go Up / Down Stairs: 1 flight, modified independent, With rail, to maximize functional mobility and independence, Goal met       Francene Finders, PT, DPT 2:16 PM 06/01/2015  Pager [phone]      Time of treatment:   PT Received On: 06/01/15  Start Time: 1250  Stop Time: 1330  Time Calculation (min): 40 min

## 2015-06-02 ENCOUNTER — Other Ambulatory Visit: Payer: Self-pay

## 2015-06-02 MED ORDER — OXYCODONE HCL 5 MG PO TABS
5.0000 mg | ORAL_TABLET | ORAL | 0 refills | Status: DC | PRN
Start: 2015-06-02 — End: 2015-12-19
  Filled 2015-06-02: qty 60, 8d supply, fill #0

## 2015-06-02 NOTE — Progress Notes (Signed)
Was discharged instructions given signed verbalized of understanding all belongings with him going home accompanied by wife.

## 2015-06-02 NOTE — OT Progress Note (Signed)
Occupational Therapy Note    [facility]   Occupational Therapy Treatment     Patient: Jesus Bowman    MRN#: [id number]   Unit: [facility] TOWER 8  Bed: V409/W119.14      Discharge Recommendations:   Discharge Recommendation: Home with supervision (and assistance from family for higher level adls)   DME Recommended for Discharge:  (in place)        Assessment:   Tolerated session well. Patient completed G/H at sink in standing, functional transfers and mobility in room and reviewed all safety at home with adls. No acute OT needs. D/COT    Treatment Activities: ADL RE TRAINING, Functional transfer training    Educated the patient to role of occupational therapy, plan of care, goals of therapy and HEP, safety with mobility and ADLs.    Plan:       Discharge from OT Acute Care Services.       Precautions and Contraindications:   Falls  spine    Updated Medical Status/Imaging/Labs:  reviewed    Subjective:I feel better   Patient's medical condition is appropriate for Occupational Therapy intervention at this time.  Patient is agreeable to participation in the therapy session.    Pain:   Scale: 4/10  Location: back  Intervention: nsg informed and medicated patient during session    Objective:   Patient is in chair with dressings, SCD's and peripheral IV in place.      Cognition  intact    Functional Mobility  Rolling:  nt  Supine to Sit: SBA  Sit to Stand: SBA  Transfers: SBA    Balance  Static Sitting: Good  Dynamic Sitting: fair  Static Standing: fair  Dynamic Standing: fair    Self Care and Home Management  Eating: set up  Grooming: set up  Bathing: set up  UE Dressing: set up  LE Dressing: set up  Toileting: set up    Therapeutic Exercises  With actiivty    Participation: good  Endurance: fair    Patient left with call bell within reach, all needs met, SCDs on, fall mat on, bed alarm off, chair alarm off and all questions answered. RN notified of session outcome and patient  response.     Goals:met  Time For Goal Achievement: 3 visits  ADL Goals  Patient will dress lower body: Supervision  Patient will toilet: Supervision  Mobility and Transfer Goals  Pt will perform functional transfers: Supervision                             Time of Treatment  OT Received On: 06/02/15  Start Time: 0945  Stop Time: 1010  Time Calculation (min): 25 min    Treatment # 1   Tora Perches OTR/L  Pager [phone]

## 2015-06-02 NOTE — Plan of Care (Signed)
Problem: Day 1 Post-op- Lumbar Discectomy/Laminectomy/Fusion  Goal: Free from Infection  Outcome: Progressing  A&Ox4, VSS, afebrile. Dressing to the back c/d/i. Jp drain intact. Voiding without difficulty. Encouraged to use IS.   Goal: Mobility/activity is maintained at optimum level for patient  Outcome: Progressing  Ambulating in the room, minimal assist. Spine precautions maintained.   Goal: Pain at adequate level as identified by patient  Outcome: Progressing  Pt report moderate pain to the back, controlled with prn oxycodone 10 mg. No acute distress . Will continue to assess.   Goal: Patient will maintain normal GI status  Outcome: Progressing  No N/V, tolerating po intake well. Active normal bowel sounds. Passing gas. No BM yet, administered Miralax. Will continue to monitor.   Goal: Stable Neurovascular Status  Outcome: Progressing  NI,, good pulses and capillary refill. SCD's on. Will continue to monitor.

## 2015-06-02 NOTE — Discharge Summary (Signed)
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 Painful orthopaedic hardware   . 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  Qty: 60 tablet, Refills: 0         CONTINUE these medications which have NOT CHANGED    Details   Ascorbic Acid (VITAMIN C PO) Take 1 tablet by mouth daily.        b complex vitamins tablet Take 1 tablet by mouth daily.         Coenzyme Q10 (COQ10 PO) Take by mouth daily.      Garlic Oil 2 MG Cap Take 1 capsule by mouth nightly.      Ginger, Zingiber officinalis, (GINGER EXTRACT) 250 MG Cap Take 1 tablet by mouth nightly.      Misc Natural Products (PROSTATE SUPPORT PO) Take 1 tablet by mouth daily.        olmesartan (BENICAR) 40 MG tablet Take 40 mg by mouth daily.        pregabalin (LYRICA) 100 MG capsule Take 100 mg by mouth 2 (two) times daily.      rosuvastatin (CRESTOR) 10 MG tablet Take 10 mg by mouth nightly.        TURMERIC PO Take by mouth.      VITAMIN D, ERGOCALCIFEROL, PO Take 1 tablet by mouth every other day.        amoxicillin (AMOXIL) 500 MG capsule Take 1 capsule by  mouth as needed. For dental procedures      Cinnamon 500 MG capsule Take 1 capsule by mouth nightly.         STOP taking these medications       diclofenac (VOLTAREN) 75 MG EC tablet        oxyCODONE-acetaminophen (PERCOCET) 5-325 MG per tablet                Discharge Instructions:      Follow-up with Dewaine Oats, C-ANP in 2 weeks     Signed by: Dewaine Oats, C-ANP

## 2015-06-02 NOTE — Progress Notes (Signed)
PROGRESS NOTE    Date Time: 06/02/2015 8:42 AM  Patient Name: Jesus Bowman  Attending Physician: Clelia Schaumann, MD    Assessment:   Stable.    Plan:   D/C to home.  Follow up in office in two weeks.   PT/OT for ambulation and mobilization  Subjective:   Expected incisional pain.  Ambulating well. Dressing changed, wound clean.  Drain D/Cd. Wearing anti-embolism stockings.     Medications:     Current Facility-Administered Medications   Medication Dose Route Frequency   . pregabalin  100 mg Oral BID   . rosuvastatin  10 mg Oral QHS   . senna-docusate  1 tablet Oral BID   . valsartan  320 mg Oral Daily       Physical Exam:     Filed Vitals:    06/02/15 0647   BP: 138/79   Pulse: 81   Temp: 99.8 F (37.7 C)   Resp: 18   SpO2: 95%         Intake/Output Summary (Last 24 hours) at 06/02/15 0842  Last data filed at 06/02/15 1610   Gross per 24 hour   Intake    780 ml   Output    815 ml   Net    -35 ml       Drains:     Closed/Suction Drain Posterior Bulb 10 Fr. (Active)   Number of days:1633       Closed/Suction Drain Back 10 Fr. (Active)   Site Description Unable to view 06/01/2015  9:08 PM   Dressing Status Clean;Dry;Intact 06/01/2015  9:08 PM   Drainage Appearance Serosanguineous 06/01/2015  9:08 PM   Status To bulb suction 06/01/2015  9:08 PM   Output (mL) 40 mL 06/02/2015  6:52 AM   Number of days:2       [REMOVED] Urethral Catheter Latex 16 Fr. (Removed)   Removed 06/01/15 0541   Catheter necessity reviewed? Yes 05/31/2015 11:16 PM   Site Assessment Clean;Intact 05/31/2015 11:16 PM   Collection Container Standard drainage bag 05/31/2015 11:16 PM   Securement Method Stat lock 05/31/2015 11:16 PM   Reason for Continuing Urinary Catheterization past POD 1 Immobilization/bedrest for trauma or surgery due to potentially unstable spine 05/31/2015 11:16 PM   Positioned catheter tubing for unobstructed urine flow: Yes 05/31/2015 11:16 PM   Output (mL) 150 mL 06/01/2015  5:40 AM   Number of days:1         Labs:     Lab  Results   Component Value Date    WBC 8.45 06/01/2015    HGB 12.4* 06/01/2015    HCT 38.8* 06/01/2015    MCV 93.0 06/01/2015    PLT 232 06/01/2015               Signed by: Dewaine Oats, C-ANP  Date/Time: 06/02/2015 8:42 AM

## 2015-11-10 ENCOUNTER — Other Ambulatory Visit: Payer: Self-pay | Admitting: Orthopaedic Surgery

## 2015-12-19 ENCOUNTER — Ambulatory Visit: Payer: Medicare Other | Attending: Gastroenterology

## 2015-12-19 NOTE — Pre-Procedure Instructions (Signed)
   No preop orders noted

## 2015-12-27 ENCOUNTER — Ambulatory Visit: Payer: Medicare Other | Admitting: Pain Medicine

## 2015-12-27 ENCOUNTER — Encounter
Admission: RE | Disposition: A | Discharge status: Home / Self Care | Payer: Self-pay | Source: Ambulatory Visit | Attending: Gastroenterology

## 2015-12-27 ENCOUNTER — Ambulatory Visit
Admission: RE | Admit: 2015-12-27 | Discharge: 2015-12-27 | Disposition: A | Discharge status: Home / Self Care | Payer: Medicare Other | Source: Ambulatory Visit | Attending: Gastroenterology | Admitting: Gastroenterology

## 2015-12-27 ENCOUNTER — Ambulatory Visit: Payer: Medicare Other | Admitting: Gastroenterology

## 2015-12-27 DIAGNOSIS — G629 Polyneuropathy, unspecified: Secondary | ICD-10-CM | POA: Insufficient documentation

## 2015-12-27 DIAGNOSIS — K5730 Diverticulosis of large intestine without perforation or abscess without bleeding: Secondary | ICD-10-CM | POA: Insufficient documentation

## 2015-12-27 DIAGNOSIS — Z800 Family history of malignant neoplasm of digestive organs: Secondary | ICD-10-CM | POA: Insufficient documentation

## 2015-12-27 DIAGNOSIS — K648 Other hemorrhoids: Secondary | ICD-10-CM | POA: Insufficient documentation

## 2015-12-27 DIAGNOSIS — Z1211 Encounter for screening for malignant neoplasm of colon: Secondary | ICD-10-CM | POA: Insufficient documentation

## 2015-12-27 DIAGNOSIS — E785 Hyperlipidemia, unspecified: Secondary | ICD-10-CM | POA: Insufficient documentation

## 2015-12-27 DIAGNOSIS — I10 Essential (primary) hypertension: Secondary | ICD-10-CM | POA: Insufficient documentation

## 2015-12-27 DIAGNOSIS — Z86010 Personal history of colonic polyps: Secondary | ICD-10-CM | POA: Insufficient documentation

## 2015-12-27 DIAGNOSIS — M1990 Unspecified osteoarthritis, unspecified site: Secondary | ICD-10-CM | POA: Insufficient documentation

## 2015-12-27 HISTORY — PX: COLONOSCOPY: SHX174

## 2015-12-27 SURGERY — DONT USE, USE 1094-COLONOSCOPY, DIAGNOSTIC (SCREENING)
Anesthesia: Anesthesia General | Site: Abdomen | Wound class: Clean Contaminated

## 2015-12-27 MED ORDER — PROPOFOL 10 MG/ML IV EMUL (WRAP)
INTRAVENOUS | Status: DC | PRN
Start: 2015-12-27 — End: 2015-12-27
  Administered 2015-12-27: 100 mg via INTRAVENOUS
  Administered 2015-12-27: 20 mg via INTRAVENOUS
  Administered 2015-12-27: 80 mg via INTRAVENOUS
  Administered 2015-12-27: 50 mg via INTRAVENOUS

## 2015-12-27 MED ORDER — LIDOCAINE HCL 2 % IJ SOLN
INTRAMUSCULAR | Status: DC | PRN
Start: 2015-12-27 — End: 2015-12-27
  Administered 2015-12-27: 40 mg

## 2015-12-27 MED ORDER — SODIUM CHLORIDE 0.9 % IV SOLN
INTRAVENOUS | Status: DC
Start: 2015-12-27 — End: 2015-12-27

## 2015-12-27 MED ORDER — LACTATED RINGERS IV SOLN
INTRAVENOUS | Status: DC
Start: 2015-12-27 — End: 2015-12-27

## 2015-12-27 SURGICAL SUPPLY — 33 items
FORCEPS BIOPSY L240 CM JUMBO MICROMESH (Instrument) IMPLANT
FORCEPS BIOPSY L240 CM JUMBO MICROMESH TEETH STREAMLINE CATHETER (Instrument) IMPLANT
FORCEPS BIOPSY L240 CM LARGE CAPACITY (Instrument) IMPLANT
FORCEPS BIOPSY L240 CM MICROMESH TEETH STREAMLINE CATHETER NEEDLE (Instrument) IMPLANT
FORCEPS BX SS JMB RJ 4 2.8MM 240CM STRL (Instrument)
FORCEPS BX SS LG CPC RJ 4 2.4MM 240CM (Instrument)
GLOVES EXAM NITRILE ETS LG NS (Glove) ×4 IMPLANT
GOWN ISL PP PE REG LG LF FULL BCK NK TIE (Gown) ×4 IMPLANT
GOWN ISOLATION REGULAR LARGE FULL BACK NECK TIE ELASTIC CUFF (Gown) ×2 IMPLANT
MARKER ENDOSCOPIC PERMANENT INDICATION (Syringes, Needles) IMPLANT
MARKER ENDOSCOPIC PERMANENT INDICATION DARK SYRINGE SPOT EX 5 ML (Syringes, Needles) IMPLANT
MARKER ESCP 5ML SPOT EX PERM INDCT DRK (Syringes, Needles)
NEEDLE CARR-LOCKE INJECT 25GX5 (Needles) IMPLANT
PAD ELECTROSRG GRND REM W CRD (Procedure Accessories) IMPLANT
PROBE ELECTROSURGICAL L220 CM FLEXIBLE (Procedure Accessories) IMPLANT
PROBE ELECTROSURGICAL L220 CM FLEXIBLE STRAIGHT FIRE OD2.3 MM FIAPC (Procedure Accessories) IMPLANT
PROBE ESURG FIAPC 2.3MM 220CM STRL FLXB (Procedure Accessories)
SNARE ELECTRO SURG 20MM (Procedure Accessories) IMPLANT
SNARE ESCP MIC CPTVTR 13MM 240IN STRL (GE Lab Supplies) IMPLANT
SNARE SENSATION OVAL 27MM (Endoscopic Supplies) IMPLANT
SNARE SMALL HEXAGON CAPTIVATOR STIFF ENDOSCOPIC POLYPECTOMY (GE Lab Supplies) IMPLANT
SPONGE GAUZE L4 IN X W4 IN 16 PLY (Dressing) ×1 IMPLANT
SPONGE GAUZE L4 IN X W4 IN 16 PLY MAXIMUM ABSORBENT USP TYPE VII (Dressing) ×1 IMPLANT
SPONGE GZE CTTN CRTY 4X4IN LF NS 16 PLY (Dressing) ×1
SYRINGE 50 ML GRADUATE NONPYROGENIC DEHP (Syringes, Needles) IMPLANT
SYRINGE 50 ML GRADUATE NONPYROGENIC DEHP FREE PVC FREE BD MEDICAL (Syringes, Needles) IMPLANT
SYRINGE MED 50ML LF STRL GRAD N-PYRG (Syringes, Needles)
TRAP  MUCOUS SPECIMEN 40CC (Procedure Accessories) IMPLANT
TRAP SPEC REM ETRAP 15CM LF STRL MAGNIFY (Procedure Accessories)
TRAP SPECIMEN REMOVAL L15 CM MAGNIFY (Procedure Accessories) IMPLANT
TRAP SPECIMEN REMOVAL L15 CM MAGNIFY WINDOW MEASUREMENT GUIDE ETRAP (Procedure Accessories) IMPLANT
WATER STERILE PLASTIC POUR BOTTLE 250 ML (Irrigation Solutions) ×1 IMPLANT
WATER STRL 250ML LF PLS PR BTL (Irrigation Solutions) ×1

## 2015-12-27 NOTE — H&P (Signed)
GI PRE PROCEDURE NOTE    Proceduralist Comments:   Review of Systems and Past Medical / Surgical History performed: Yes     Indications:History of colonic polyps    Previous Adverse Reaction to Anesthesia or Sedation (if yes, describe): No    Physical Exam / Laboratory Data (If applicable)   Airway Classification: Class Bowman    General: Alert and cooperative  Lungs: Lungs clear to auscultation  Cardiac: RRR, normal S1S2.    Abdomen: Soft, non tender. Normal active bowel sounds  Other:     No labs drawn    American Society of Anesthesiologists (ASA) Physical Status Classification:   ASA 3 - Patient with moderate systemic disease with functional limitations    Planned Sedation:   Deep sedation with anesthesia    Attestation:   Jesus Bowman has been reassessed immediately prior to the procedure and is an appropriate candidate for the planned sedation and procedure. Risks, benefits and alternatives to the planned procedure and sedation have been explained to the patient or guardian:  yes        Signed by: Halina Andreas

## 2015-12-27 NOTE — Addendum Note (Signed)
Addendum  created 12/27/15 1444 by Renaldo Fiddler, MD    Sign clinical note

## 2015-12-27 NOTE — Anesthesia Preprocedure Evaluation (Addendum)
Anesthesia Evaluation    AIRWAY    Mallampati: II    TM distance: >3 FB  Neck ROM: full  Mouth Opening:full   CARDIOVASCULAR    cardiovascular exam normal, regular, normal and murmur       DENTAL         PULMONARY    pulmonary exam normal and clear to auscultation     OTHER FINDINGS            Relevant Problems   No active problems are marked relevant to this note.       PSS Anesthesia Comments: Obese,BMI 39,2        Anesthesia Plan    ASA 3     general                     intravenous induction     Monitors/Adjuncts: other      Post op pain management: per surgeon    informed consent obtained                   Signed by: Juanell Fairly 12/27/15 1:33 PM

## 2015-12-27 NOTE — Transfer of Care (Signed)
Anesthesia Transfer of Care Note    Patient: Jesus Bowman    Procedures performed: Procedure(s) with comments:  COLONOSCOPY - Colonoscopy  Q1-Unk     Anesthesia type: General TIVA    Patient location:[name] PACU    Last vitals:   Vitals:    12/27/15 1306   BP: 132/74   Pulse: 65   Resp: 16   Temp: 36.5 C (97.7 F)   SpO2: 95%       Post pain: Patient not complaining of pain, continue current therapy      Mental Status:awake    Respiratory Function: tolerating room air    Cardiovascular: stable    Nausea/Vomiting: patient not complaining of nausea or vomiting    Hydration Status: adequate    Post assessment: no apparent anesthetic complications    Signed by: Renaldo Fiddler  12/27/15 2:35 PM

## 2015-12-27 NOTE — Discharge Instr - AVS First Page (Signed)
Endoscopy Discharge Instructions  General Instructions:  1. Following sedation, your judgement, perception, and coordination are considered impaired. Even though you may feel awake and alert, you are considered legally intoxicated. Therefore, until the next morning;   Do not Drive   Do not operate appliances or equipment that requires reaction time (e.g. stove, electrical tools, machinery)   Do not sign legal documents or be involved in important decisions.   Do not smoke if alone   Do not drink alcoholic beverages   Go directly home and rest for several hours before resuming your routine activities.   It is highly recommended to have a responsible adult stay with you for the next 24 hours    2. Tenderness, swelling or pain may occur at the IV site where you received sedation. If you experience this, apply warm soaks to the area. Notify your physician if this persists.    Instructions Specific To Procedures - Report To Physician Any Of The Following:       Colon/Sigmoidoscopy/Proctoscopy   1. Severe and persistent abdominal pain/bloating which does not subside within   2-3 hours   2. Large amount of rectal bleeding (some mucosal blood streaking may occur,   especially if biopsy or polypectomy was done or if hemorrhoids are present.   3. Nausea/vomitting   4. Fevers/Chills within 24 hours after procedure. Temp>101deg F     Additional Discharge Instructions  Your diet after the procedure: Resume Previous Diet  Patient education literature given: Procedural Report      If you have questions or problems contact your MD immediately. If you need immediate attention, call your MD, 911 and/or go to nearest emergency room.

## 2015-12-27 NOTE — Anesthesia Postprocedure Evaluation (Signed)
Anesthesia Post Evaluation    Patient: Jesus Bowman    Procedures performed: Procedure(s) with comments:  COLONOSCOPY - Colonoscopy  Q1-Unk     Anesthesia type: General TIVA    Patient location:[name] PACU    Vitals: Reviewed and Stable.  See nurses notes for vital signs.      Post pain: Patient not complaining of pain, continue current therapy     Mental Status:awake    Respiratory Function: Doing well on room air    Cardiovascular: stable    Nausea/Vomiting: patient not complaining of nausea or vomiting    Hydration Status: adequate    Post assessment: no apparent anesthetic complications, no reportable events and no evidence of recall    Vernona Rieger C. Filipescu-Turner, MD

## 2015-12-28 ENCOUNTER — Encounter: Payer: Self-pay | Admitting: Gastroenterology

## 2016-06-07 DIAGNOSIS — Z8719 Personal history of other diseases of the digestive system: Secondary | ICD-10-CM

## 2016-06-07 HISTORY — DX: Personal history of other diseases of the digestive system: Z87.19

## 2016-06-08 ENCOUNTER — Telehealth: Payer: Medicare Other

## 2016-06-12 NOTE — Pre-Procedure Instructions (Signed)
Called Dr.Lane's office [phone] for preops.

## 2016-06-12 NOTE — Pre-Procedure Instructions (Signed)
No antibiotics ordered.   Type and Screen ordered for DOS.  Emailed patient JIM class schedule and link to website, hibiclens instructions and Joint brochure  [email or web address]  Faxed surgeon office to fyi recent tooth abscess and root canal, requested dental clearance, medical clearance, surgeon's H&P, consent , labs, ekg and cxr. Fax [phone]

## 2016-06-13 ENCOUNTER — Encounter: Admission: RE | Payer: Self-pay | Source: Ambulatory Visit

## 2016-06-13 ENCOUNTER — Inpatient Hospital Stay
Admission: RE | Admit: 2016-06-13 | Payer: Medicare (Managed Care) | Source: Ambulatory Visit | Admitting: Orthopaedic Surgery

## 2016-06-13 SURGERY — ARTHROPLASTY, HIP TOTAL
Anesthesia: General | Site: Hip | Laterality: Right

## 2016-06-15 NOTE — Pre-Procedure Instructions (Signed)
   Pre-op testing and evaluation reviewed and in Epic.   Pt will hold Cannabis for 48 prior to surgery.

## 2016-06-18 ENCOUNTER — Inpatient Hospital Stay: Payer: Medicare (Managed Care) | Admitting: Anesthesiology

## 2016-06-18 ENCOUNTER — Inpatient Hospital Stay: Payer: Medicare (Managed Care)

## 2016-06-18 ENCOUNTER — Inpatient Hospital Stay
Admission: RE | Admit: 2016-06-18 | Discharge: 2016-06-20 | DRG: 470 | Disposition: A | Discharge status: Home Health | Payer: Medicare (Managed Care) | Source: Ambulatory Visit | Attending: Orthopaedic Surgery | Admitting: Orthopaedic Surgery

## 2016-06-18 ENCOUNTER — Encounter
Admission: RE | Disposition: A | Discharge status: Home Health | Payer: Self-pay | Source: Ambulatory Visit | Attending: Orthopaedic Surgery

## 2016-06-18 ENCOUNTER — Ambulatory Visit: Payer: Self-pay

## 2016-06-18 DIAGNOSIS — E785 Hyperlipidemia, unspecified: Secondary | ICD-10-CM | POA: Diagnosis present

## 2016-06-18 DIAGNOSIS — M169 Osteoarthritis of hip, unspecified: Secondary | ICD-10-CM | POA: Diagnosis present

## 2016-06-18 DIAGNOSIS — I10 Essential (primary) hypertension: Secondary | ICD-10-CM | POA: Diagnosis present

## 2016-06-18 DIAGNOSIS — K219 Gastro-esophageal reflux disease without esophagitis: Secondary | ICD-10-CM | POA: Diagnosis present

## 2016-06-18 DIAGNOSIS — Z79899 Other long term (current) drug therapy: Secondary | ICD-10-CM

## 2016-06-18 DIAGNOSIS — M1611 Unilateral primary osteoarthritis, right hip: Principal | ICD-10-CM

## 2016-06-18 HISTORY — PX: ARTHROPLASTY, HIP TOTAL: SHX3107

## 2016-06-18 LAB — TYPE AND SCREEN
AB Screen Gel: NEGATIVE
ABO Rh: B POS

## 2016-06-18 SURGERY — ARTHROPLASTY, HIP TOTAL
Anesthesia: Anesthesia General | Site: Pelvis | Laterality: Right | Wound class: Clean

## 2016-06-18 MED ORDER — LACTATED RINGERS IV SOLN
75.0000 mL/h | INTRAVENOUS | Status: DC
Start: 2016-06-18 — End: 2016-06-18

## 2016-06-18 MED ORDER — OXYCODONE-ACETAMINOPHEN 5-325 MG PO TABS
ORAL_TABLET | ORAL | Status: AC
Start: 2016-06-18 — End: 2016-06-18
  Administered 2016-06-18: 16:00:00 2 via ORAL
  Filled 2016-06-18: qty 2

## 2016-06-18 MED ORDER — PROPOFOL 10 MG/ML IV EMUL (WRAP)
INTRAVENOUS | Status: AC
Start: 2016-06-18 — End: ?
  Filled 2016-06-18: qty 20

## 2016-06-18 MED ORDER — MIDAZOLAM HCL 2 MG/2ML IJ SOLN
INTRAMUSCULAR | Status: AC
Start: 2016-06-18 — End: ?
  Filled 2016-06-18: qty 2

## 2016-06-18 MED ORDER — PROMETHAZINE HCL 25 MG/ML IJ SOLN
6.2500 mg | Freq: Once | INTRAMUSCULAR | Status: DC | PRN
Start: 2016-06-18 — End: 2016-06-18

## 2016-06-18 MED ORDER — CEFAZOLIN SODIUM-DEXTROSE 2-3 GM-% IV SOLR
INTRAVENOUS | Status: AC
Start: 2016-06-18 — End: 2016-06-18
  Administered 2016-06-18: 15:00:00 2 g via INTRAVENOUS
  Filled 2016-06-18: qty 50

## 2016-06-18 MED ORDER — GLYCOPYRROLATE 0.2 MG/ML IJ SOLN
INTRAMUSCULAR | Status: DC | PRN
Start: 2016-06-18 — End: 2016-06-18
  Administered 2016-06-18: .8 mg via INTRAVENOUS
  Administered 2016-06-18: 0.1 mg via INTRAVENOUS

## 2016-06-18 MED ORDER — LIDOCAINE HCL 2 % IJ SOLN
INTRAMUSCULAR | Status: DC | PRN
Start: 2016-06-18 — End: 2016-06-18
  Administered 2016-06-18: 100 mg via INTRAVENOUS

## 2016-06-18 MED ORDER — NALOXONE HCL 0.4 MG/ML IJ SOLN (WRAP)
0.4000 mg | INTRAMUSCULAR | Status: DC | PRN
Start: 2016-06-18 — End: 2016-06-20

## 2016-06-18 MED ORDER — SODIUM CHLORIDE 0.9 % IR SOLN
Status: DC | PRN
Start: 2016-06-18 — End: 2016-06-18
  Administered 2016-06-18: 1000 mL

## 2016-06-18 MED ORDER — NEOSTIGMINE METHYLSULFATE 1 MG/ML IJ/IV SOLN (WRAP)
Status: DC | PRN
Start: 2016-06-18 — End: 2016-06-18
  Administered 2016-06-18: 5 mg via INTRAVENOUS

## 2016-06-18 MED ORDER — PROPOFOL 10 MG/ML IV EMUL (WRAP)
INTRAVENOUS | Status: DC | PRN
Start: 2016-06-18 — End: 2016-06-18
  Administered 2016-06-18: 200 mg via INTRAVENOUS
  Administered 2016-06-18: 50 mg via INTRAVENOUS

## 2016-06-18 MED ORDER — HYDROMORPHONE HCL 2 MG PO TABS
2.0000 mg | ORAL_TABLET | ORAL | Status: DC | PRN
Start: 2016-06-18 — End: 2016-06-20

## 2016-06-18 MED ORDER — CEFAZOLIN SODIUM-DEXTROSE 2-3 GM-% IV SOLR
2.0000 g | Freq: Three times a day (TID) | INTRAVENOUS | Status: AC
Start: 2016-06-18 — End: 2016-06-18
  Administered 2016-06-18: 22:00:00 2 g via INTRAVENOUS
  Filled 2016-06-18: qty 50

## 2016-06-18 MED ORDER — LACTATED RINGERS IV SOLN
INTRAVENOUS | Status: DC
Start: 2016-06-18 — End: 2016-06-18

## 2016-06-18 MED ORDER — ONDANSETRON 4 MG PO TBDP
4.0000 mg | ORAL_TABLET | Freq: Three times a day (TID) | ORAL | Status: DC | PRN
Start: 2016-06-18 — End: 2016-06-20

## 2016-06-18 MED ORDER — CEFAZOLIN SODIUM-DEXTROSE 2-3 GM-% IV SOLR
2.0000 g | Freq: Three times a day (TID) | INTRAVENOUS | Status: AC
Start: 2016-06-18 — End: 2016-06-18
  Administered 2016-06-18: 08:00:00 2 g via INTRAVENOUS

## 2016-06-18 MED ORDER — OXYCODONE HCL 5 MG PO TABS
ORAL_TABLET | ORAL | Status: AC
Start: 2016-06-18 — End: 2016-06-18
  Administered 2016-06-18: 13:00:00 5 mg via ORAL
  Filled 2016-06-18: qty 1

## 2016-06-18 MED ORDER — DIPHENHYDRAMINE HCL 50 MG/ML IJ SOLN
INTRAMUSCULAR | Status: AC
Start: 2016-06-18 — End: ?
  Filled 2016-06-18: qty 1

## 2016-06-18 MED ORDER — ONDANSETRON HCL 4 MG/2ML IJ SOLN
4.0000 mg | Freq: Once | INTRAMUSCULAR | Status: DC | PRN
Start: 2016-06-18 — End: 2016-06-18

## 2016-06-18 MED ORDER — DIPHENHYDRAMINE HCL 25 MG PO CAPS
25.0000 mg | ORAL_CAPSULE | Freq: Two times a day (BID) | ORAL | Status: DC | PRN
Start: 2016-06-18 — End: 2016-06-20

## 2016-06-18 MED ORDER — HYDROMORPHONE HCL 4 MG PO TABS
4.0000 mg | ORAL_TABLET | ORAL | Status: DC | PRN
Start: 2016-06-18 — End: 2016-06-20

## 2016-06-18 MED ORDER — CEFAZOLIN SODIUM-DEXTROSE 2-3 GM-% IV SOLR
INTRAVENOUS | Status: AC
Start: 2016-06-18 — End: ?
  Filled 2016-06-18: qty 50

## 2016-06-18 MED ORDER — DIPHENHYDRAMINE HCL 25 MG PO CAPS
25.0000 mg | ORAL_CAPSULE | Freq: Every evening | ORAL | Status: DC | PRN
Start: 2016-06-18 — End: 2016-06-20

## 2016-06-18 MED ORDER — ONDANSETRON HCL 4 MG/2ML IJ SOLN
INTRAMUSCULAR | Status: AC
Start: 2016-06-18 — End: ?
  Filled 2016-06-18: qty 2

## 2016-06-18 MED ORDER — FENTANYL CITRATE (PF) 50 MCG/ML IJ SOLN (WRAP)
INTRAMUSCULAR | Status: DC | PRN
Start: 2016-06-18 — End: 2016-06-18
  Administered 2016-06-18: 100 ug via INTRAVENOUS
  Administered 2016-06-18: 25 ug via INTRAVENOUS
  Administered 2016-06-18: 100 ug via INTRAVENOUS
  Administered 2016-06-18: 50 ug via INTRAVENOUS
  Administered 2016-06-18: 25 ug via INTRAVENOUS

## 2016-06-18 MED ORDER — ENOXAPARIN SODIUM 40 MG/0.4ML SC SOLN
40.0000 mg | Freq: Every day | SUBCUTANEOUS | Status: DC
Start: 2016-06-19 — End: 2016-06-20
  Administered 2016-06-19 – 2016-06-20 (×2): 40 mg via SUBCUTANEOUS
  Filled 2016-06-18 (×2): qty 0.4

## 2016-06-18 MED ORDER — OXYCODONE-ACETAMINOPHEN 5-325 MG PO TABS
2.0000 | ORAL_TABLET | ORAL | Status: DC | PRN
Start: 2016-06-18 — End: 2016-06-20
  Administered 2016-06-18 – 2016-06-20 (×8): 2 via ORAL
  Filled 2016-06-18 (×8): qty 2

## 2016-06-18 MED ORDER — GLYCOPYRROLATE 0.2 MG/ML IJ SOLN
INTRAMUSCULAR | Status: AC
Start: 2016-06-18 — End: ?
  Filled 2016-06-18: qty 4

## 2016-06-18 MED ORDER — FERROUS SULFATE 324 (65 FE) MG PO TBEC
324.0000 mg | DELAYED_RELEASE_TABLET | Freq: Every morning | ORAL | Status: DC
Start: 2016-06-18 — End: 2016-06-20
  Administered 2016-06-18 – 2016-06-20 (×3): 324 mg via ORAL
  Filled 2016-06-18 (×3): qty 1

## 2016-06-18 MED ORDER — PREGABALIN 100 MG PO CAPS
100.0000 mg | ORAL_CAPSULE | Freq: Two times a day (BID) | ORAL | Status: DC
Start: 2016-06-18 — End: 2016-06-20
  Administered 2016-06-19 – 2016-06-20 (×3): 100 mg via ORAL
  Filled 2016-06-18 (×3): qty 1

## 2016-06-18 MED ORDER — HYDROMORPHONE HCL 0.5 MG/0.5 ML IJ SOLN
INTRAMUSCULAR | Status: AC
Start: 2016-06-18 — End: 2016-06-18
  Administered 2016-06-18: 11:00:00 0.5 mg via INTRAVENOUS
  Filled 2016-06-18: qty 0.5

## 2016-06-18 MED ORDER — HYDROMORPHONE HCL 0.5 MG/0.5 ML IJ SOLN
INTRAMUSCULAR | Status: AC
Start: 2016-06-18 — End: 2016-06-18
  Administered 2016-06-18: 13:00:00 0.5 mg via INTRAVENOUS
  Filled 2016-06-18: qty 1

## 2016-06-18 MED ORDER — HYDROMORPHONE HCL 1 MG/ML IJ SOLN
1.0000 mg | INTRAMUSCULAR | Status: DC | PRN
Start: 2016-06-18 — End: 2016-06-20

## 2016-06-18 MED ORDER — VALSARTAN 160 MG PO TABS
320.0000 mg | ORAL_TABLET | Freq: Every day | ORAL | Status: DC
Start: 2016-06-18 — End: 2016-06-20
  Administered 2016-06-19 – 2016-06-20 (×2): 320 mg via ORAL
  Filled 2016-06-18 (×3): qty 2

## 2016-06-18 MED ORDER — DIPHENHYDRAMINE HCL 50 MG/ML IJ SOLN
12.5000 mg | Freq: Four times a day (QID) | INTRAMUSCULAR | Status: DC | PRN
Start: 2016-06-18 — End: 2016-06-18

## 2016-06-18 MED ORDER — ONDANSETRON HCL 4 MG/2ML IJ SOLN
4.0000 mg | Freq: Three times a day (TID) | INTRAMUSCULAR | Status: DC | PRN
Start: 2016-06-18 — End: 2016-06-20

## 2016-06-18 MED ORDER — FENTANYL CITRATE (PF) 50 MCG/ML IJ SOLN (WRAP)
INTRAMUSCULAR | Status: AC
Start: 2016-06-18 — End: 2016-06-18
  Administered 2016-06-18: 11:00:00 25 ug via INTRAVENOUS
  Filled 2016-06-18: qty 2

## 2016-06-18 MED ORDER — ACETAMINOPHEN 325 MG PO TABS
ORAL_TABLET | ORAL | Status: AC
Start: 2016-06-18 — End: ?
  Filled 2016-06-18: qty 2

## 2016-06-18 MED ORDER — ACETAMINOPHEN 325 MG PO TABS
650.0000 mg | ORAL_TABLET | Freq: Once | ORAL | Status: AC
Start: 2016-06-18 — End: 2016-06-18
  Administered 2016-06-18: 07:00:00 650 mg via ORAL

## 2016-06-18 MED ORDER — ROSUVASTATIN CALCIUM 10 MG PO TABS
10.0000 mg | ORAL_TABLET | Freq: Every evening | ORAL | Status: DC
Start: 2016-06-18 — End: 2016-06-20
  Administered 2016-06-18 – 2016-06-19 (×2): 10 mg via ORAL
  Filled 2016-06-18 (×2): qty 1

## 2016-06-18 MED ORDER — HYDROMORPHONE HCL 0.5 MG/0.5 ML IJ SOLN
INTRAMUSCULAR | Status: AC
Start: 2016-06-18 — End: 2016-06-18
  Administered 2016-06-18: 12:00:00 0.5 mg via INTRAVENOUS
  Filled 2016-06-18: qty 0.5

## 2016-06-18 MED ORDER — ROCURONIUM BROMIDE 10 MG/ML IV SOLN (WRAP)
INTRAVENOUS | Status: DC | PRN
Start: 2016-06-18 — End: 2016-06-18
  Administered 2016-06-18: 10 mg via INTRAVENOUS
  Administered 2016-06-18: 45 mg via INTRAVENOUS
  Administered 2016-06-18: 5 mg via INTRAVENOUS

## 2016-06-18 MED ORDER — OXYCODONE-ACETAMINOPHEN 5-325 MG PO TABS
1.0000 | ORAL_TABLET | ORAL | Status: DC | PRN
Start: 2016-06-18 — End: 2016-06-20

## 2016-06-18 MED ORDER — MEPERIDINE HCL 25 MG/ML IJ SOLN
12.5000 mg | INTRAMUSCULAR | Status: DC | PRN
Start: 2016-06-18 — End: 2016-06-18

## 2016-06-18 MED ORDER — BACITRACIN 50000 UNITS IM SOLR
INTRAMUSCULAR | Status: DC | PRN
Start: 2016-06-18 — End: 2016-06-18
  Administered 2016-06-18: 50000 [IU]

## 2016-06-18 MED ORDER — LIDOCAINE HCL (PF) 2 % IJ SOLN
INTRAMUSCULAR | Status: AC
Start: 2016-06-18 — End: ?
  Filled 2016-06-18: qty 5

## 2016-06-18 MED ORDER — ROCURONIUM BROMIDE 50 MG/5ML IV SOLN
INTRAVENOUS | Status: AC
Start: 2016-06-18 — End: ?
  Filled 2016-06-18: qty 5

## 2016-06-18 MED ORDER — PROMETHAZINE HCL 25 MG/ML IJ SOLN
6.2500 mg | Freq: Four times a day (QID) | INTRAMUSCULAR | Status: DC | PRN
Start: 2016-06-18 — End: 2016-06-20

## 2016-06-18 MED ORDER — CELECOXIB 200 MG PO CAPS
200.0000 mg | ORAL_CAPSULE | Freq: Two times a day (BID) | ORAL | Status: DC
Start: 2016-06-18 — End: 2016-06-20
  Administered 2016-06-19 – 2016-06-20 (×3): 200 mg via ORAL
  Filled 2016-06-18 (×3): qty 1

## 2016-06-18 MED ORDER — FENTANYL CITRATE (PF) 50 MCG/ML IJ SOLN (WRAP)
25.0000 ug | INTRAMUSCULAR | Status: AC | PRN
Start: 2016-06-18 — End: 2016-06-18
  Administered 2016-06-18 (×3): 25 ug via INTRAVENOUS

## 2016-06-18 MED ORDER — PHENYLEPHRINE HCL 10 MG/ML IV SOLN (WRAP)
Status: DC | PRN
Start: 2016-06-18 — End: 2016-06-18
  Administered 2016-06-18 (×2): 100 ug via INTRAVENOUS
  Administered 2016-06-18: 150 ug via INTRAVENOUS

## 2016-06-18 MED ORDER — PHENYLEPHRINE 100 MCG/ML IN NACL 0.9% IV SOSY
PREFILLED_SYRINGE | INTRAVENOUS | Status: AC
Start: 2016-06-18 — End: ?
  Filled 2016-06-18: qty 5

## 2016-06-18 MED ORDER — FAMOTIDINE 10 MG/ML IV SOLN (WRAP)
INTRAVENOUS | Status: DC | PRN
Start: 2016-06-18 — End: 2016-06-18
  Administered 2016-06-18: 20 mg via INTRAVENOUS

## 2016-06-18 MED ORDER — OXYCODONE HCL 5 MG PO TABS
5.0000 mg | ORAL_TABLET | Freq: Once | ORAL | Status: AC | PRN
Start: 2016-06-18 — End: 2016-06-18

## 2016-06-18 MED ORDER — HYDROMORPHONE HCL 0.5 MG/0.5 ML IJ SOLN
0.5000 mg | INTRAMUSCULAR | Status: AC | PRN
Start: 2016-06-18 — End: 2016-06-18

## 2016-06-18 MED ORDER — FAMOTIDINE 20 MG/2ML IV SOLN
INTRAVENOUS | Status: AC
Start: 2016-06-18 — End: ?
  Filled 2016-06-18: qty 2

## 2016-06-18 MED ORDER — DOCUSATE SODIUM 100 MG PO CAPS
100.0000 mg | ORAL_CAPSULE | Freq: Two times a day (BID) | ORAL | Status: DC
Start: 2016-06-18 — End: 2016-06-20
  Administered 2016-06-18 – 2016-06-19 (×3): 100 mg via ORAL
  Filled 2016-06-18 (×4): qty 1

## 2016-06-18 MED ORDER — PROMETHAZINE HCL 25 MG PO TABS
25.0000 mg | ORAL_TABLET | Freq: Four times a day (QID) | ORAL | Status: DC | PRN
Start: 2016-06-18 — End: 2016-06-20

## 2016-06-18 MED ORDER — PROMETHAZINE HCL 25 MG RE SUPP
25.0000 mg | Freq: Four times a day (QID) | RECTAL | Status: DC | PRN
Start: 2016-06-18 — End: 2016-06-20

## 2016-06-18 MED ORDER — BISACODYL 10 MG RE SUPP
10.0000 mg | Freq: Every day | RECTAL | Status: DC | PRN
Start: 2016-06-18 — End: 2016-06-20

## 2016-06-18 MED ORDER — CELECOXIB 200 MG PO CAPS
ORAL_CAPSULE | ORAL | Status: AC
Start: 2016-06-18 — End: 2016-06-18
  Administered 2016-06-18: 16:00:00 200 mg via ORAL
  Filled 2016-06-18: qty 1

## 2016-06-18 MED ORDER — PREGABALIN 100 MG PO CAPS
ORAL_CAPSULE | ORAL | Status: AC
Start: 2016-06-18 — End: 2016-06-18
  Administered 2016-06-18: 20:00:00 100 mg via ORAL
  Filled 2016-06-18: qty 1

## 2016-06-18 MED ORDER — HYDROMORPHONE HCL 1 MG/ML PO LIQD
ORAL | Status: DC
Start: 2016-06-18 — End: 2016-06-18
  Filled 2016-06-18: qty 1

## 2016-06-18 MED ORDER — GLYCOPYRROLATE 0.2 MG/ML IJ SOLN
INTRAMUSCULAR | Status: AC
Start: 2016-06-18 — End: ?
  Filled 2016-06-18: qty 1

## 2016-06-18 MED ORDER — ONDANSETRON HCL 4 MG/2ML IJ SOLN
INTRAMUSCULAR | Status: DC | PRN
Start: 2016-06-18 — End: 2016-06-18
  Administered 2016-06-18: 4 mg via INTRAVENOUS

## 2016-06-18 MED ORDER — MIDAZOLAM HCL 2 MG/2ML IJ SOLN
INTRAMUSCULAR | Status: DC | PRN
Start: 2016-06-18 — End: 2016-06-18
  Administered 2016-06-18: 2 mg via INTRAVENOUS

## 2016-06-18 MED ORDER — SODIUM CHLORIDE 0.9 % IV SOLN
INTRAVENOUS | Status: DC
Start: 2016-06-18 — End: 2016-06-20

## 2016-06-18 MED ORDER — ACETAMINOPHEN 325 MG PO TABS
650.0000 mg | ORAL_TABLET | ORAL | Status: DC | PRN
Start: 2016-06-18 — End: 2016-06-20

## 2016-06-18 MED ORDER — ACETAMINOPHEN 325 MG PO TABS
650.0000 mg | ORAL_TABLET | Freq: Once | ORAL | Status: DC | PRN
Start: 2016-06-18 — End: 2016-06-18

## 2016-06-18 SURGICAL SUPPLY — 113 items
APPLCATOR CHLORAPREP 26ML (Prep) ×4 IMPLANT
BASIN MAJOR SET (Kits) ×1
BIT DRILL L20 MM OD3.2 MM (Drillbits) ×1 IMPLANT
BIT DRILL L20 MM OD3.2 MM RINGLOC+ (Drillbits) ×1 IMPLANT
BIT DRL 3.2MM 20MM DISP (Drillbits) ×1
BLADE ELECTRODE EXTENDED 6IN (Cautery) ×2 IMPLANT
BLADE SAW SAG 25X90X1.27X127MM (Blade) ×1
BLADE SAW SAGITTAL HEAVY DUTY L91.5 MM X (Blade) ×1 IMPLANT
BLADE SAW SAGITTAL HEAVY DUTY NA L91.5 MM X W25 MM X H1.27 MM (Blade) ×1 IMPLANT
BLADE SW 91.5X25X1.27MM STRL SGTL HVDTY (Blade) ×1
BUR MATCH 9CM X 3MM (Burr)
CARTRIDGE LUB DIFFUSER LEGEND (Ortho Supply)
CLEANER ELECTROSURGICAL TIP PENCIL (Procedure Accessories) ×1 IMPLANT
CLEANER ELECTROSURGICAL TIP PENCIL HANDSWITCH LECTROBRASIVE (Procedure Accessories) ×1 IMPLANT
CLEANER ESURG TIP PNCL LCTRBRS STRL (Procedure Accessories) ×1
COVER FLEXIBLE LIGHT HANDLE PLASTIC GREEN (Procedure Accessories) ×4 IMPLANT
COVER FLEXIBLE MEDLINE LIGHT HANDLE (Procedure Accessories) ×4 IMPLANT
COVER LGHT HNDL PLS LF STRL FLXB DISP (Procedure Accessories) ×4
CVR LGHTHNDL FLEX SFT 1PK (Procedure Accessories) ×4
DRAPE SRG TBRN CNVRT ASTND 134X115IN LF (Drape) ×1
DRAPE SRG TBRN LG CNVRT 98X72IN LF STRL (Procedure Accessories) ×4
DRAPE SURGICAL FANFOLD L98 IN X W72 IN (Procedure Accessories) ×4 IMPLANT
DRAPE SURGICAL FANFOLD L98 IN X W72 IN CONVERTORS TIBURON LARGE (Procedure Accessories) ×4 IMPLANT
DRAPE SURGICAL REINFORCE ARMBOARD COVER (Drape) ×1 IMPLANT
DRAPE SURGICAL REINFORCE ARMBOARD COVER POUCH ABSORBENT L134 IN X W115 (Drape) ×1 IMPLANT
DRAPE TIBURON HIP WITH POUCH (Drape) ×1
DRESSING PETRO 3% BI 3BRM GZE XR 9X5IN (Dressing) ×1 IMPLANT
DRESSING PETROLATUM XEROFORM L9 IN X W5 IN 3% BISMUTH TRIBROMOPHENATE (Dressing) ×1 IMPLANT
DRESSING XEROFORM 5X9IN (Dressing) ×1
DRILL BIT RNGLC ACT 3.2 X 20MM (Drillbits) ×1
ECHO POR FMRL NC 13X145 (Stem) ×1 IMPLANT
FORCEPS ELECTROSURGICAL L7 3/4 IN 1.5 MM BAYONET BIPOLAR INSULATE CORD (Disposable Instruments) ×1 IMPLANT
FORCEPS ESURG SS 1.5MM BYNT LBRTY S-G (Disposable Instruments) ×1 IMPLANT
FORCEPS SCVL BAYONET 7-3/4IN (Disposable Instruments) ×1
GLOVE SRG NTR RBR 7 BGL IND 288X91MM LTX (Glove) ×1
GLOVE SURG BIOGEL INDIC SZ 7.0 (Glove) ×1
GLOVE SURG BIOGEL INDIC SZ 8.5 (Glove) ×6 IMPLANT
GLOVE SURG BIOGEL SZ6.5 (Glove) ×4 IMPLANT
GLOVE SURGICAL 7 BIOGEL INDICATOR POWDER (Glove) ×1 IMPLANT
GLOVE SURGICAL 7 BIOGEL INDICATOR POWDER FREE SMOOTH BEAD CUFF (Glove) ×1 IMPLANT
GOWN STANDARD XLG W/ PAPER TWL (Gown) ×1 IMPLANT
GOWN STNDRD XLG (W/ PAPER TWL) (Gown) ×2 IMPLANT
HEAD FEM COCR +3MM OFST G7 32MM STRL (Head) ×1 IMPLANT
HEAD FEMORAL OD32 MM +3 MM  OFFSET HIP COCR G7 MODULAR  TYPE 1  SKIRT (Head) ×1 IMPLANT
HEAD FEMORAL OD32 MM +3 MM OFFSET HIP (Head) ×1 IMPLANT
HEAD MODULAR 32MM 3MM NECK (Head) ×1 IMPLANT
KIT JOINT TOTAL IFH (Kits) ×2 IMPLANT
LINER ACETABULAR ID32 MM E E1 G7 HIGH (Liner) ×1 IMPLANT
LINER ACETABULAR ID32 MM E E1 G7 HIGH WALL HIP (Liner) ×1 IMPLANT
LINER ACTB E1 E G7 32MM HW HIP (Liner) ×1 IMPLANT
LINER G7 HI WALL E1 32MM SZE (Liner) ×1 IMPLANT
LUBRICANT INST MDSRX DFSR CRTDG MR7 (Ortho Supply)
LUBRICANT INSTRUMENT DIFFUSER CARTRIDGE (Ortho Supply) IMPLANT
LUBRICANT INSTRUMENT DIFFUSER CARTRIDGE MIDAS REX MR7 (Ortho Supply) IMPLANT
NEEDLE  19G ASPIRATING (Needles) ×2 IMPLANT
NEEDLE MAYO CATGUT SZ3.51/2 C (Suture) ×1
NEEDLE SUT SS CATGUT .5 CRC RCHRD-ALLAN (Suture) ×1
NEEDLE SUTURE 1/2 CIRCLE L1.404 IN (Suture) ×1 IMPLANT
NEEDLE SUTURE 1/2 CIRCLE L1.404 IN TROCAR POINT FREE EYE RICHARD-ALLAN (Suture) ×1 IMPLANT
PAD ELECTROSRG GRND REM W CRD (Procedure Accessories) ×2 IMPLANT
PAD VALLEYLAB SCRATCH 5.08CM (Procedure Accessories) ×1
PILLOW ABDUCTION STD LG (Immobilizer) ×2 IMPLANT
POUCH INST 18X30CM (Drape) ×2 IMPLANT
SCREW BN TRLG 6.5MM 20MM STRL ST HIP (Screw) ×2 IMPLANT
SCREW L20 MM OD6.5 MM ACETABULAR HIP SELF TAP BONE TRILOGY (Screw) ×2 IMPLANT
SCREW L20 MM OD6.5 MM HIP ACETABULAR (Screw) ×2 IMPLANT
SCREW TRILOGY ACETAB 6.5X20MM (Screw) ×2 IMPLANT
SET SRGBSN LF STRL MAJ DISP (Kits) ×1
SET SRGBSN LF STRL TRNSF DISP (Kits) ×1
SET SURGICAL BASIN MAJOR (Kits) ×1 IMPLANT
SET SURGICAL BASIN MAJOR MEDLINE INDUSTRIES, INC. (Kits) ×1 IMPLANT
SET SURGICAL BASIN TRANSFER (Kits) ×1 IMPLANT
SET SURGICAL BASIN TRANSFER MEDLINE INDUSTRIES, INC. (Kits) ×1 IMPLANT
SET TRANSFER BASIN (Kits) ×1
SHEET LARGE DRAPE 72X86IN (Procedure Accessories) ×4
SHELL ACETABULAR OD52 MM HIP LIMIT HOLE (Shell) ×1 IMPLANT
SHELL ACETABULAR OD52 MM HIP LIMIT HOLE COLOR CODED PPS G7 E (Shell) ×1 IMPLANT
SHELL ACTB PPS E HMSPHR OFST G7 52MM LMT (Shell) ×1 IMPLANT
SHELL G7 ACETAB 3H SZ E 52MM (Shell) ×1 IMPLANT
SOL NACL INJ 0.9% 30ML BACTER (IV Solutions) ×2
SOLUTION IV 0.9% SODIUM CHLORIDE 30 ML (IV Solutions) ×1 IMPLANT
SOLUTION IV 0.9% SODIUM CHLORIDE 30 ML BACTERIOSTATIC MULTIPLE DOSE (IV Solutions) ×1 IMPLANT
SPONGE LAP 18X18IN PREWASH WHT (Sponge) ×1
SPONGE LAP XRAY 18X18IN (Sponge) ×1
SPONGE LAPAROTOMY L18 IN X W18 IN (Sponge) ×1 IMPLANT
SPONGE LAPAROTOMY L18 IN X W18 IN PREWASH WHITE (Sponge) ×1 IMPLANT
SPONGE SRG VISTEC 4X4IN LF STRL 16 PLY (Sponge) ×1
SPONGE SURGICAL L4 IN X W4 IN 16 PLY (Sponge) ×1 IMPLANT
SPONGE VISTEC 16PLY 4X4IN (Sponge) ×1
STEM FEM PPS TI 135D STD OFST ECHO BMTRC (Stem) ×1 IMPLANT
STEM FEMORAL L145 MM OD13 MM 135 D (Stem) ×1 IMPLANT
STEM FEMORAL L145 MM OD13 MM 135 D STANDARD OFFSET PRESS FIT FULL (Stem) ×1 IMPLANT
SUTURE ABS 1 CT1 VCL 36IN BRD COAT VIOL (Suture)
SUTURE COATED VICRYL 1 CT-1 L36 IN BRAID (Suture) IMPLANT
SUTURE COATED VICRYL 1 CT-1 L36 IN BRAID COATED VIOLET ABSORBABLE (Suture) IMPLANT
SUTURE ETHIBOND 1 CT1 30IN (Suture)
SUTURE ETHIBOND EXCEL 1 CT-1 L30 IN (Suture) IMPLANT
SUTURE ETHIBOND EXCEL 1 CT-1 L30 IN BRAID NONABSORBABLE (Suture) IMPLANT
SUTURE ETHILON 3-0 PSL (Suture) IMPLANT
SUTURE NABSB 1 CT1 EBND EXC 30IN BRD (Suture)
SUTURE VICRYL 1 CT1 27IN (Suture)
SUTURE VICRYL 2-0 CT1 36IN (Suture) IMPLANT
SYRINGE LUER LOCK 10CC (Syringes, Needles) ×2 IMPLANT
TOOL DISSECTING L9 CM MATCH HEAD FLUTE (Burr) IMPLANT
TOOL DISSECTING L9 CM MATCH HEAD FLUTE OD3 MM MIDAS REX LEGEND (Burr) IMPLANT
TOOL DSCT MTCH HD LGND 3MM 9CM (Burr)
TOWEL STERILE REUSABLE 8PK (Procedure Accessories) ×6 IMPLANT
TUBE SCT MN CRV KAMVAC LF STRL (Suction) ×2
TUBE SUCT CRVD MINI 10IN (Suction) ×2
TUBE SUCTION MINI CURVE KAMVAC (Suction) ×2 IMPLANT
WAX BN STRL 2.5G NTR (Hemostat)
WAX BONE 2.5 G NATURAL (Hemostat) IMPLANT
WAX BONE 2.5GM (Hemostat)

## 2016-06-18 NOTE — Anesthesia Preprocedure Evaluation (Addendum)
Anesthesia Evaluation    AIRWAY    Mallampati: IV    TM distance: >3 FB  Neck ROM: full  Mouth Opening:limited   CARDIOVASCULAR    cardiovascular exam normal       DENTAL           PULMONARY    pulmonary exam normal     OTHER FINDINGS    BMI 38      Relevant Problems   No relevant active problems       PSS Anesthesia Comments: Right hip DJD  S/p lumbar decompression, fusion, hardware removal and replacement  OSA?  Denies diagnosis of mitral stenosis - no hx CHF  Pain control - currently on Percocet (5/325) 5-6/day and lyrica. Took both today.  Completed antibiotic course for dental infection 5 days ago.        Anesthesia Plan    ASA 3     general                     Detailed anesthesia plan: general endotracheal        Post op pain management: per surgeon    informed consent obtained    Plan discussed with CRNA.      pertinent labs reviewed (creat 1.3, hct 38)             Signed by: Jesus Bowman 06/18/16 6:50 AM

## 2016-06-18 NOTE — Anesthesia Postprocedure Evaluation (Signed)
Anesthesia Post Evaluation    Patient: Jesus Bowman    Procedure(s) with comments:  ARTHROPLASTY, HIP TOTAL - RIGHT TOTAL HIP ARTHROPLASTY    Anesthesia type: General ETT    Last Vitals:   Vitals:    06/18/16 1150   BP: 135/65   Pulse: 74   Resp:    Temp:    SpO2: 98%       Patient Location: Phase I PACU      Post Pain: Patient not complaining of pain, continue current therapy    Mental Status: awake and alert    Respiratory Function: tolerating nasal cannula    Cardiovascular: stable    Nausea/Vomiting: patient not complaining of nausea or vomiting    Hydration Status: adequate    Post Assessment: no apparent anesthetic complications and no reportable events          Anesthesia Qualified Clinical Data Registry 2018    PACU Reintubation  Did the Patient have general anesthesia with intubation: Yes  Did the Patient require reintubation in the PACU?: No  Was this a planned exubation trial (documented in the medical record)?: No    PONV Adult  Is the patient aged 63 or older: Yes  Did the patient receive recieve a general anesthestic: Yes  Does the patient have 3 or more risk factors for PONV? No  Did the patient receive anti-emetics from at least two classes of medications? Yes      PONV Pediatric  Is the patient aged 93-17? No            PACU Transfer Checklist Protocol  Was the patient transferred to the PACU at the conclusion of surgery? Yes  Was a checklist or transfer protocol used? Yes    ICU Transfer Checklist Protocol  Was the patient transferred to the ICU at the conclusion of surgery? No      Post-op Pain Assessment Prior to Anesthesia Care End  Age >=18 and assessed for pain in PACU: Yes  Pacu pain score <7/10: Yes      Perioperative Mortality  Perioperative mortality prior to Anesthesia end time: No    Perioperative Cardiac Arrest  Did the patient have an unanticipated intraoperative cardiac arrest between anesthesia start time and anesthesia end time? No    Unplanned Admission to ICU  Did  the patient have an unplanned admission to the ICU (not initially anticipated at anesthesia start time)? No      Signed by: Jamesetta Orleans, 06/18/2016 12:06 PM

## 2016-06-18 NOTE — Transfer of Care (Signed)
Anesthesia Transfer of Care Note    Patient: Jesus Bowman    Procedures performed: Procedure(s) with comments:  ARTHROPLASTY, HIP TOTAL - RIGHT TOTAL HIP ARTHROPLASTY    Anesthesia type: General ETT    Patient location:Phase I PACU    Last vitals:   Vitals:    06/18/16 1041   BP: 138/75   Pulse: 77   Resp: 16   Temp: 36.4 C (97.6 F)   SpO2: 99%       Post pain: Patient not complaining of pain, continue current therapy      Mental Status:awake and alert     Respiratory Function: tolerating face mask    Cardiovascular: stable    Nausea/Vomiting: patient not complaining of nausea or vomiting    Hydration Status: adequate    Post assessment: no apparent anesthetic complications and no reportable events    Signed by: Dorise Hiss  06/18/16 10:42 AM

## 2016-06-18 NOTE — PT Eval Note (Addendum)
[facility]   Physical Therapy Evaluation   Patient: Jesus Bowman    MRN#: [id number]   Unit: [id number]  Bed: [facility]    Discharge Recommendations:   Discharge Recommendation: Home with home health PT, Home with supervision   DME Recommendation: DME Recommended for Discharge: Front wheel walker        Assessment:   Jesus Bowman is a 61 y.o. male admitted 06/18/2016.  Pt presents with decreased functional mobility POD #0 s/p R THA.  Clarified THA precautions with PA via phone. Per her, Dr Bonnetta Barry anterior precautions are no hip ADD, no flex past 90 degrees and no IR.  Educated her that these are what we call Posterior precautions.  Pt was educated on our posterior precautions. Pt tolerated session well and appears appropriate for all pathway goals.     Risk Assessment and Prediction Tool (RAPT)     Value Score   1. What is your age? 50-65 years = 2  66-75 years = 1  >75 year = 0      2   2. Gender? Male = 2  Male = 1         2   3. How far on average can you walk?         (a block is 200 meters) Two or more (+/- rest) = 2  1-2 blocks (+/- rest) = 1  Housebound (most of the time) = 0       2   4. Which gait aid do you use?  (more often than not) None = 2  Single-point stick = 1  Crutches/Frame = 0       1   5. Do you use community supports? (home help, meals on wheels, district nursing) None or one per week = 1  Two or more per week = 0    1   6. Will you live with someone who can care for you after your operation? Yes = 3  No = 0       3      Total score (out of 12):      11                 Impairments: Assessment: Decreased LE ROM;Decreased LE strength;Decreased endurance/activity tolerance;Decreased functional mobility;Decreased balance;Gait impairment.     Therapy Diagnosis: decreased functional mobility, hip pain    Rehabilitation Potential: Prognosis: Good;With continued PT status post acute discharge    Treatment Activities: PT evaluation,  gait training, transfer training, therex/hep (ankle pumps, GS, QS x 10), pt education/training, d/c planning, bed mobility training.   Educated the patient to role of physical therapy, plan of care, goals of therapy and HEP, safety with mobility and ADLs, hip precautions, weight bearing precautions, discharge instructions, home safety.    Plan:   Treatment/Interventions: Exercise, Gait training, Neuromuscular re-education, Functional transfer training, LE strengthening/ROM, Endurance training, Patient/family training, Equipment eval/education, Bed mobility, Compensatory technique education, Continued evaluation     PT Frequency: 4-5x/wk   Risks/Benefits/POC Discussed with Pt/Family: With patient        Precautions and Contraindications:   Weight Bearing Status: RLE WBAT  Total Hip Replacement: no flexion past 90 degrees, other (comment), no crossing legs, no internal rotation, no ADDuction (per PA, these are dr lanes "ant precautions")  Precaution Instructions Given to Patient: Yes  Other Precautions: falls, obesity    Consult received for Jesus Bowman for PT  Evaluation and Treatment.  Patient's medical condition is appropriate for Physical therapy intervention at this time.    Medical Diagnosis: Primary osteoarthritis of right hip [M16.11]      History of Present Illness:   Jesus Bowman is a 61 y.o. male admitted on 06/18/2016 with (per chart notes) " . . . .Primary osteoarthritis of right hip"       Past Medical/Surgical History:  Past Medical History:   Diagnosis Date   . Abnormal vision     glasses   . Calculus of kidney 2007    One episode   . Congenital mitral insufficiency     Per [facility]  November 09, 2014, pt denies   . Difficulty in walking(719.7)     stiffness-uses cane occasionally   . GERD (gastroesophageal reflux disease)     uses baking soda or alka seltzer prn - infrequent   . History of abscessed tooth 06/07/2016    root canal and amoxicillin    . Hyperlipidemia      medically managed   . Hypertension     under control with medication   . Low back pain     related to sitting - will require additional surgery in future   . Osteoarthrosis     right hip   . Rash     chronic groin rash    . Snoring     no sleep study   . Spinal stenosis    . Tinnitus    . Urinary incontinence     Per [facility]  November 09, 2014  Dr. Milagros Evener      Past Surgical History:   Procedure Laterality Date   . CARPAL TUNNEL RELEASE Bilateral 2003, 2008    left 2008, right 2003   . COLONOSCOPY  09/2010    multiple   . COLONOSCOPY N/A 12/27/2015    Procedure: COLONOSCOPY;  Surgeon: Halina Andreas, MD;  Location: [facility];  Service: Gastroenterology;  Laterality: N/A;  Colonoscopy  Q1-Unk    . EYE SURGERY Left March 30, 2013     Laser to repair torn retina    . JOINT REPLACEMENT Left 2010    left hip   . LAMINECTOMY, POSTERIOR LUMBAR, DECOMP, FUSION, LEVEL 2 N/A 05/31/2015    Procedure: LAMINECTOMY, POSTERIOR LUMBAR, DECOMP, FUSION, LEVEL 2;  Surgeon: Clelia Schaumann, MD;  Location: [facility];  Service: Orthopedics;  Laterality: N/A;  L3-L5 LAMINECTOMY, L4-L5 FUSION WITH INFUSE   . REMOVAL, POSTERIOR LUMBAR SPINE HARDWARE N/A 11/18/2014    Procedure: REMOVAL, POSTERIOR LUMBAR SPINE HARDWARE;  Surgeon: Clelia Schaumann, MD;  [facility];  Service: Orthopedics;  Laterality: N/A;  L4-L5 REMOVAL OF HARDWARE   . SPINE SURGERY  2006,2011,2012    laminectomy 2006 & 2011, fusion 2012         X-Rays/Tests/Labs:  No current labs in chart    XR Hip Right AP And Lateral (including prosthesis) [[id number]] (Order [id number])   Status: Final result   Study Result   Clinical History: Right hip arthroplasty.     Findings: AP and frog-leg lateral views of the right hip. Compared with  06/05/2008.     Of the right hip prosthesis is present. Adjacent soft tissue gas  compatible with recent postoperative state. No acute fracture or  dislocation is identified. Evaluation is somewhat limited by patient  body habitus  and portable technique.     IMPRESSION:    Status  post right hip arthroplasty.     [name], MD   06/18/2016 1:24 PM       Social History:   Prior Level of Function:  Prior level of function: Ambulates with assistive device  Assistive Device: Single point cane  Baseline Activity Level: Community ambulation  DME Currently at Home: Single point cane    Home Living Arrangements:  Living Arrangements: Spouse/significant other  Type of Home: House  Home Layout: Two level, Bed/bath upstairs, Stairs to enter with rails (add number in comment), Other (10 ste w/railing, flt to bed/bath w/railing)  Bathroom Shower/Tub: Tub/shower unit  Bathroom Toilet: Raised  Bathroom Equipment: Grab bars in shower  DME Currently at Home: Single point cane    Subjective:   Patient is agreeable to participation in the therapy session. Nursing clears patient for therapy.     Patient Goal: to decrease pain and from recovery to floor    Pain Assessment  Pain Assessment: Numeric Scale (0-10)  Pain Score: 9-severe pain  POSS Score: Awake and Alert  Pain Location: Hip;Leg  Pain Orientation: Right  Pain Descriptors: Sore  Pain Frequency: Increases with movement;Constant/continuous  Effect of Pain on Daily Activities: moderate  Pain Intervention(s): Medication (See eMAR);Repositioned;Elevated;Rest  Multiple Pain Sites: No    Objective:   Observation of Patient/Vital Signs:  Patient is seated at edge of bed with dressings, telemetry, SCD's and peripheral IV in place.    Observation of Patient/Vital signs:  Inspection/Posture: sitting up on eob, PIV, hospital bed, SCD, dressing, cont pulse ox, telemetry    Cognition/Neuro Status  Arousal/Alertness: Appropriate responses to stimuli  Attention Span: Appears intact  Orientation Level: Oriented X4  Memory: Appears intact  Following Commands: Follows multistep commands with increased time;Follows multistep commands with repetition  Safety Awareness: minimal verbal instruction  Insights: Educated in  safety awareness;Decreased awareness of deficits  Problem Solving: Able to problem solve independently  Behavior: calm;cooperative;attentive  Motor Planning: intact  Coordination: intact  Hand Dominance: right handed        Musculoskeletal Examination:  Gross ROM  Right Upper Extremity ROM: within functional limits  Left Upper Extremity ROM: within functional limits  Right Lower Extremity ROM: needs focused assessment  R Hip Flexion 0-125: 50  R Knee Flexion 0-140: 60  Left Lower Extremity ROM: needs focused assessment  L Hip Flexion 0-125: 85  L Knee Flexion 0-140: 100    Gross Strength  Right Upper Extremity Strength: within functional limits  Left Upper Extremity Strength: within functional limits  Right Lower Extremity Strength: within functional limits  Left Lower Extremity Strength: 3-/5 (no mmt, able to lift against gravity)         Functional Mobility:  Supine to Sit: Unable to assess (Comment) (sitting on eob)  Sit to Supine: Minimal Assist  Sit to Stand: Minimal Assist;Moderate Assist  Stand to Sit: Minimal Assist;Moderate Assist         Ambulation:  PMP - Progressive Mobility Protocol   PMP Activity: Step 6 - Walks in Room  Distance Walked (ft) (Step 6,7): 30 Feet     Ambulation: Minimal Assist;Contact Guard Assist;with front-wheeled walker  Pattern: L decreased stance time;decreased step length;decreased cadence;Step to (forward flexed posture)  Stair Management: not attempted;unable to perform (comment) (POD #0 s/p L THA)         Balance:  Balance: needs focused assessment  Standing - Dynamic: Fair         Participation and Activity Tolerance:  Participation Effort: good  Endurance: Tolerates < 10 min exercise, no significant change in vital signs      Patient left with call bell within reach, all needs met, SCDs placed, fall mat n/a (seen in PACU), bed alarm n/a (seen in PACU), chair alarm n/a and all questions answered. RN notified of session outcome and patient response.       Goals:   Goals  Goal  Formulation: With patient  Time for Goal Acheivement: 3 visits  Goals: Select goal  Pt Will Go Supine To Sit: with supervision  Pt Will Transfer Bed/Chair: with rolling walker, with supervision  Pt Will Ambulate: 151-200 feet, with rolling walker, with supervision  Pt Will Go Up / Down Stairs: 1 flight, with minimal assist, With rail, With [facility]       Carron Brazen, DPT  Pager # [phone]      Time of treatment:   PT Received On: 06/18/16  Start Time: 1558  Stop Time: 1635  Time Calculation (min): 37 min

## 2016-06-18 NOTE — Discharge Instr - AVS First Page (Addendum)
Reason for your Hospital Admission:    Right Total Hip Replacement       Instructions for after your discharge:    Constipation Information Card -     Mixing alcohol and pain medications can cause dizziness and slow your breathing.    Don't drink alcoholic beverages while taking pain medications.       Post-Operative Instructions for   Total Hip Replacement                     H.Edward Zenda Alpers, M.D.                                               [phone]      1.   Elevate the leg (from under the heel and ankle only) and apply an ice pack for 20           minutes every hour as needed to help reduce pain and swelling.    2. Use walker to walk.  You can put as much weight on your leg as you can tolerate. Wean from walker to a cane as tolerated.    3. Change your dressing daily with 4x4 gauze and Tegaderm or medical tape to secure the edges.  Do not get the incision wet.  No baths/hot tubs/pools for 4 weeks. Do not put any lotions/creams/ointments on incision.    4. You will need to wear compression stockings for 1 month after surgery.    5. You may not drive until evaluated by me in the office and I give you clearance.    6. You will have Home Physical Therapy for 2 weeks as set up in the hospital prior to your discharge. Schedule outpatient PT to begin as soon as possible after your first-operative appointment with Dr. Maurice March in approximately 2 weeks. An order will be given to you at this office visit, but you can call ahead to schedule these outpatient PT sessions at your preferred PT facility.     7. Do not take anti-inflammatories (Advil, Aleve, Motrin, Ibuprofen, etc), Aspirin or Diclofenac while taking Lovenox.    8. Lovenox will be a once a day injection for 2 weeks.    9. Take pain medication as directed for pain relief. Please call the office for prescription refills 1-2 days before you run out. Pain medication will not be filled on weekends.    Pain Medication Tips:  - Do not drive while taking pain  medications.  - Do not drink alcoholic beverages while taking pain medications.  - Pain medication should be taken with food as this will help prevent any stomach upset.  - Often pain medications will cause constipation. Eat high fiber foods and increase your fluid intake if possible.   - To alleviate constipation, purchase a stool softener at any pharmacy and follow the recommended directions on the                             bottle.    10. Make a postoperative appointment with my office within 10-14 days after surgery by calling [phone] xt. [phone] (Ly) if you do not already have an appointment scheduled.    11. If you develop severe pain, redness, swelling in the leg, difficulty breathing, or fever greater than 101.60F call my office.  Fredderick Severance, physician assistant, can be reached at [phone] xt. [phone].    Home Health Discharge Information     Your doctor has ordered Physical Therapy in-home service(s) for you while you recuperate at home, to assist you in the transition from hospital to home.      The agency that you or your representative chose to provide the service:  Name of Home Health Agency: [facility] [phone])]      The [employer]:  Name of DME Agency: [facility] [phone]   Equipment Ordered: Levan Hurst     The above services were set up by:  Marigene Ehlers  [facility] Liaison)

## 2016-06-18 NOTE — Op Note (Addendum)
Procedure Date: 06/18/2016     Patient Type: I     SURGEON: Blanchard Kelch MD  ASSISTANT:  Gaylan Gerold PA     PREOPERATIVE DIAgnosis  DJD RIGHT Hip     POSTOPERATIVE DIAGNOSIS:  same     TITLE OF PROCEDURE:  Right total hip   NOTE:  Prior to procedure, risks, benefits, and alternatives had been discussed  extensively with this patient as per habit.  He had recently undergone  pulling of a tooth about 10 days or so ago and was cleared by his dentist  to proceed.  He did understand because of his weight and the dental  manipulation that his risks of infection were somewhat higher, but  hopefully this will be managed appropriately.  He had collapsing increasing  pain in the right hip with changes on his x-ray, which were substantial and  required a dressing as outlined below.     Ms. Fredderick Severance assisted through all aspects of the procedure.  This was a  very large gentleman and very difficult because of size and without her  help it would have been impossible to achieve a successful outcome.  This  required extensive detailed dissection around the hip itself due to  fragmentation of the acetabulum and much more attention to detail was  necessary in order to come to a successful outcome.  Special care was taken  and Ms. Tennis Ship assisted through all aspects.  Details are outlined below.     DESCRIPTION OF PROCEDURE:  The patient was placed in left lateral decubitus position after general  anesthesia, was prepped with ChloraPrep.  The leg was prepped and draped  free for hip procedure.  Modified anterolateral approach was utilized.     Initially, an incision was made over the trochanter and carried distally so  that an anterolateral approach could be utilized.  The patient had  tremendous scarring around the hip and with his massive size extension of  the incision was necessary not only to expose the hip, but also to  dislocate and perform the necessary reaming and broaching.     Carefully, the hip was identified and  debrided.  There was tremendous  scarring and adhesions of the hip into the pelvis and required very  meticulous dissection in order to expose and resect the capsule.  The  anterosuperior margin of the acetabulum was impinged upon due to  subluxation and collapse of the hip and this was debrided back leaving a  reasonable shell for situation of the cup.     The cup was sequentially reamed to accommodate a 52-mm porous-coated cup  with 2 separate 6.5 x 20-mm screws.  They were drilled and placed into  approximately the 10 and 11 o'clock position.  High wall liner was placed  using an E-poly due to the weight of the patient and young age and this was  placed approximately at the 1 o'clock position at its highest point.  The  liner was nicely secured and checked and noted to be quite stable.     The femur was reamed and broached ultimately to accommodate a 13-mm stem.   A 12 had been trialled, was noted to be not quite appropriate, 13 was  ultimately fit and a +3, 32-mm head and neck were used.  A standard head  was utilized.  This was reduced, noted to be stable in all planes of  motion.  Wounds were copiously irrigated, closed in layers using #1  figure-of-eight Ethibond suture for the anterior gluteus medius, which had  been released approximately one-half off the anterior part of the leg at  the level of bone.  This was nicely reapproximated.  Fascia lata, which had  been split in length with its fibers was closed with interrupted and  running #1 Ethibond suture.  The fat layer was closed using inverted  interrupted #1 Vicryl and 2-0 Vicryl for the layers and then 3-0 nylon  running simple suture for skin.  No drain utilized.  The patient returned  to recovery room in stable condition without operative complication to be  admitted as per protocol.       Again, all risks had been discussed.  The patient elected to proceed.  All  seemed to go appropriately.     Ms. Fredderick Severance was instrumental in all aspects from  holding retractors,  holding the limb, helping with reduction, helping with closure and without  her help, it would have been impossible to achieve a successful outcome.   Her help was greatly appreciated.           D:  06/18/2016 10:09 AM by Dr. Blanchard Kelch, MD [phone])  T:  06/18/2016 12:56 PM by Forde Radon      (Conf: [id number]) (Doc ID: [id number])

## 2016-06-18 NOTE — H&P (Signed)
I have reviewed the most recent history and physical included on the chart today.  I have examined the patient, and there are no significant changes in the past 24 hours.  Patient is ready for surgery - right total hip replacement.     Heart RRR  Lungs CTA

## 2016-06-18 NOTE — Brief Op Note (Signed)
BRIEF OP NOTE    Date Time: 06/18/16 10:03 AM    Patient Name:   Jesus Bowman    Date of Operation:   06/18/2016    Providers Performing:   Surgeon(s):  Estevan Oaks, MD  Carolyn Stare, [place]    Assistant (s):   Circulator: Carey Bullocks, April, RN  Scrub Person: Faythe Ghee  Preceptor: Shirlean Schlein, RN    Operative Procedure:   Procedure(s):  ARTHROPLASTY, HIP TOTAL    Preoperative Diagnosis:   Pre-Op Diagnosis Codes:     * Primary osteoarthritis of right hip [M16.11]    Postoperative Diagnosis:   Post-Op Diagnosis Codes:     * Primary osteoarthritis of right hip [M16.11]    Anesthesia:   General    Estimated Blood Loss:    130 mL    Implants:     Implant Name Type Inv. Item Serial No. Manufacturer Lot No. LRB No. Used Action   LINER G7 HI WALL E1 SZE - [id number] Liner LINER G7 HI WALL E1 SZE  [employer] [id number] Right 1 Implanted   SCREW SLFTAP BONE 6.5X20 - [id number] Screw SCREW SLFTAP BONE 6.5X20  [employer] [id number] Right 1 Implanted   SHELL G7 ACETAB 3H SZ E - [id number] Shell SHELL G7 ACETAB 3H SZ E  [employer] [id number] Right 1 Implanted   SCREW SLFTAP BONE 6.5X20 - [id number] Screw SCREW SLFTAP BONE 6.5X20  [employer] [id number] Right 1 Implanted   ECHO POR FMRL NC 13X145 - [id number] Stem ECHO POR FMRL NC 13X145  [employer] [id number] Right 1 Implanted   HEAD MODULAR  NECK - [id number] Head HEAD MODULAR  NECK   [employer] [id number] Right 1 Implanted       Drains:   Drains: no    Specimens:   * No specimens in log *     SPECIMENS (last 24 hours)      Pathology Specimens     Row Name 06/18/16 0900                Additional Information    Send final report to: Dr. Maurice March          Specimen Information    Specimen Testing Required Routine Pathology       Specimen ID  [id number]       Specimen Description right femoral head and reaming           Findings:   Above, see dictated op note    Complications:   none      Signed by: Estevan Oaks, MD                                                                            [place] [facility]

## 2016-06-19 ENCOUNTER — Encounter: Payer: Self-pay | Admitting: Orthopaedic Surgery

## 2016-06-19 DIAGNOSIS — M169 Osteoarthritis of hip, unspecified: Secondary | ICD-10-CM | POA: Diagnosis present

## 2016-06-19 LAB — BASIC METABOLIC PANEL
BUN: 14 mg/dL (ref 9.0–28.0)
CO2: 25 mEq/L (ref 21–29)
Calcium: 8.5 mg/dL (ref 8.5–10.5)
Chloride: 103 mEq/L (ref 100–111)
Creatinine: 1.1 mg/dL (ref 0.5–1.5)
Glucose: 127 mg/dL — ABNORMAL HIGH (ref 70–100)
Potassium: 4.5 mEq/L (ref 3.5–5.1)
Sodium: 137 mEq/L (ref 136–145)

## 2016-06-19 LAB — CBC
Absolute NRBC: 0 10*3/uL
Hematocrit: 33.5 % — ABNORMAL LOW (ref 42.0–52.0)
Hgb: 10.6 g/dL — ABNORMAL LOW (ref 13.0–17.0)
MCH: 28.6 pg (ref 28.0–32.0)
MCHC: 31.6 g/dL — ABNORMAL LOW (ref 32.0–36.0)
MCV: 90.3 fL (ref 80.0–100.0)
MPV: 10.2 fL (ref 9.4–12.3)
Nucleated RBC: 0 /100 WBC (ref 0.0–1.0)
Platelets: 241 10*3/uL (ref 140–400)
RBC: 3.71 10*6/uL — ABNORMAL LOW (ref 4.70–6.00)
RDW: 13 % (ref 12–15)
WBC: 6.89 10*3/uL (ref 3.50–10.80)

## 2016-06-19 LAB — HEMOLYSIS INDEX: Hemolysis Index: 14 (ref 0–18)

## 2016-06-19 LAB — GFR: EGFR: >60

## 2016-06-19 MED ORDER — OXYCODONE-ACETAMINOPHEN 5-325 MG PO TABS
1.0000 | ORAL_TABLET | ORAL | 0 refills | Status: DC | PRN
Start: 2016-06-19 — End: 2019-10-21
  Filled 2016-06-20: qty 60, 5d supply, fill #0

## 2016-06-19 MED ORDER — ENOXAPARIN SODIUM 40 MG/0.4ML SC SOLN
40.0000 mg | Freq: Every day | SUBCUTANEOUS | 0 refills | Status: AC
Start: 2016-06-20 — End: 2016-07-04
  Filled 2016-06-20: qty 5.6, 14d supply, fill #0

## 2016-06-19 NOTE — PT Progress Note (Signed)
Physical Therapy Note    [facility]   Physical Therapy Treatment  Patient:  Enos Muhl MRN#:  [id number]  Unit: [facility] TOWER 8  Bed: F825/F825.01    Discharge Recommendations:   D/C Recommendations: Home with home health PT, Home with supervision   DME Recommendations: Front wheel walker       Assessment:   Pt is making good progress toward goals. Reviewed hip precautions, therex/hep (handout updated) and issued handout for stairs with railing and spc.  Pt stated understanding of all education.  Pt left up in bathroom with instructions to use call bell when he was ready to stand up.  He stated understanding. RN and Clin Tech notified Pt is in bathroom and will call for help when ready.   Pt later seen by PT AMB out of bathroom by self.  Entered room and again educated Pt on need to call for assistance 2/2 POD #1 s/p R THA.      Assessment: Decreased LE ROM, Decreased LE strength, Decreased endurance/activity tolerance, Decreased functional mobility, Decreased balance, Gait impairment  Progress: Progressing toward goals  Prognosis: Good, With continued PT status post acute discharge  Risks/Benefits/POC Discussed with Pt/Family: With patient  Patient left without needs and call bell within reach. RN notified of session outcome.     Treatment Activities: gait training, transfer training, pt education/training, d/c planning, therex/hep, stair training, d/c planning.     Educated the patient to role of physical therapy, plan of care, goals of therapy and HEP, safety with mobility and ADLs, hip precautions, weight bearing precautions, discharge instructions, home safety.    Plan:   Treatment/Interventions: Exercise, Gait training, Stair training, Neuromuscular re-education, Functional transfer training, LE strengthening/ROM, Endurance training, Patient/family training, Equipment eval/education, Bed mobility, Compensatory technique education, Continued evaluation        PT  Frequency: 4-5x/wk     Continue plan of care.       Precautions and Contraindications:   Precautions  Weight Bearing Status: RLE WBAT  Total Hip Replacement: no flexion past 90 degrees;no crossing legs;no ADDuction;no internal rotation  Precaution Instructions Given to Patient: Yes  Other Precautions: falls, obesity    Updated Medical Status/Imaging/Labs:   Lab Results   Component Value Date/Time    HGB 10.6 (L) 06/19/2016 02:27 AM    HCT 33.5 (L) 06/19/2016 02:27 AM         Subjective:   Patient Goal: to get better and d/c home    Pain Assessment  Pain Assessment: Numeric Scale (0-10)  Pain Score: 5-moderate pain  POSS Score: Awake and Alert  Pain Location: Hip;Leg  Pain Orientation: Right  Pain Descriptors: Sore  Pain Frequency: Increases with movement;Constant/continuous  Effect of Pain on Daily Activities: moderate  Pain Intervention(s): Medication (See eMAR);Repositioned;Rest  Multiple Pain Sites: No    Patient's medical condition is appropriate for Physical Therapy intervention at this time.  Patient is agreeable to participation in the therapy session. Nursing clears patient for therapy.    Objective:   Observation of Patient/Vital Signs:  Patient is seated in a bedside chair with dressings, telemetry, SCD's and peripheral IV in place.    Cognition/Neuro Status  Arousal/Alertness: Appropriate responses to stimuli  Attention Span: Appears intact  Orientation Level: Oriented X4  Memory: Appears intact  Following Commands: Follows multistep commands with increased time;Follows multistep commands with repetition  Safety Awareness: minimal verbal instruction  Insights: Educated in safety awareness;Decreased awareness of deficits  Problem Solving: Able  to problem solve independently  Behavior: calm;cooperative;attentive  Motor Planning: intact  Coordination: intact  Hand Dominance: right handed  Orientation Level: Oriented X4         Functional Mobility:  Supine to Sit: Unable to assess (Comment) (pt up in  chair)  Sit to Supine: Unable to assess (Comment) (pt up in bathroom)  Sit to Stand: Stand by Assist;Contact Guard Assist  Stand to Sit: Stand by Assist;Contact Guard Assist       Ambulation:  PMP - Progressive Mobility Protocol   PMP Activity: Step 7 - Walks out of Room  Distance Walked (ft) (Step 6,7): 175 Feet (x2 with seated rest between)     Ambulation: Stand by Assist;with front-wheeled walker  Pattern: R decreased stance time;decreased step length;decreased cadence;Step to  Stair Management: Stand by Assist;Contact Guard Assist;one rail L;step to pattern;forward;with cane  Number of Stairs: 10 (up/down 2x6" and 3x4")    Therapeutic Exercise  Quad Sets: 10  Heelslides: 10  Glute Sets: 10  Hip Abduction: 10 (min a)  Knee AROM Short Arc Quad: 10  Ankle Pumps: 10               Patient Participation: good  Patient Endurance: fair+    Patient left with call bell within reach, all needs met, SCDs placed, fall mat in place, bed alarm n/a, chair alarm n/a (not a high fall risk) and all questions answered. RN notified of session outcome and patient response.     Goals:  Goals  Goal Formulation: With patient  Time for Goal Acheivement: 3 visits  Goals: Select goal  Pt Will Go Supine To Sit: with supervision  Pt Will Transfer Bed/Chair: with rolling walker, with supervision  Pt Will Ambulate: 151-200 feet, with rolling walker, with supervision  Pt Will Go Up / Down Stairs: 1 flight, with minimal assist, With rail, With [facility]      Carron Brazen, DPT  Pager # [phone]      Time of Treatment  PT Received On: 06/19/16  Start Time: 0955  Stop Time: 1052  Time Calculation (min): 57 min  Treatment # 1 out of 3 visits

## 2016-06-19 NOTE — Progress Notes (Signed)
Xray of hip post op looks good

## 2016-06-19 NOTE — Progress Notes (Signed)
Afeb, VSS, Mobilizing, carefully management of meds due to allergies and anxiety, stable, issues regarding admission discussed with medical staff, care explained and F\U to be arranged.Probable D\C in am if all ok.

## 2016-06-19 NOTE — Progress Notes (Signed)
Spoke to [employer] PA, patient tolerating well without oxygen, and 97% with R/A. Patient denies any SOB, lungs clear to auscultate bilaterally.     Order to d/c the continuous pulse ox.

## 2016-06-19 NOTE — Progress Notes (Signed)
Home Health Referral          Referral from Pharrah Poliard  (Case Manager) for home health care upon discharge.    By [employer], the patient has the right to freely choose a home care provider.  Arrangements have been made with:     A company of the patients choosing. We have supplied the patient with a listing of providers in your area who asked to be included and participate in Medicare.   [facility], a home care agency that provides both adult home care services which is a wholly owned and operated by [employer] and participates in [employer]   The preferred provider of your insurance company. Choosing a home care provider other than your insurance company's preferred provider may affect your insurance coverage.    The Home Health Care Referral Form acknowledging the voluntary selection of the home care company has been completed, signed, and is on file.      Home Health Discharge Information     Your doctor has ordered Physical Therapy in-home service(s) for you while you recuperate at home, to assist you in the transition from hospital to home.      The agency that you or your representative chose to provide the service:  Name of Home Health Agency: [facility] [phone])]      The [employer] Equipment [employer]:  Name of DME Agency: Other (comment) (To be determined )]  Equipment Ordered: Rolling Walker     The above services were set up by:  Marigene Ehlers  [facility] Liaison)   Phone  [phone]      Signed by: Marigene Ehlers  Date Time: 06/19/16 3:29 PM

## 2016-06-19 NOTE — Plan of Care (Signed)
Problem: Safety  Goal: Patient will be free from injury during hospitalization  Outcome: Progressing   06/19/16 2317   Goal/Interventions addressed this shift   Patient will be free from injury during hospitalization  Assess patient's risk for falls and implement fall prevention plan of care per policy;Provide and maintain safe environment;Use appropriate transfer methods;Ensure appropriate safety devices are available at the bedside;Include patient/ family/ care giver in decisions related to safety;Hourly rounding       Problem: Psychosocial and Spiritual Needs  Goal: Demonstrates ability to cope with hospitalization/illness  Outcome: Progressing   06/19/16 2317   Goal/Interventions addressed this shift   Demonstrates ability to cope with hospitalizations/illness Encourage verbalization of feelings/concerns/expectations;Provide quiet environment       Problem: Hip Surgery  Goal: Free from Infection  Outcome: Progressing   06/19/16 2317   Goal/Interventions addressed this shift   Free from infection Monitor/assess vital signs;Maintain temperature within desired parameters;Assess for signs and symptoms of infection;Assess surgical dressing, reinforce or change as needed per order;Teach/reinforce use of incentive spirometer 10 times per hour while awake, cough and deep breath as needed     Goal: Nutritional Intake is Adequate  Outcome: Progressing   06/19/16 2317   Goal/Interventions addressed this shift   Nutritional intake is adequate Assess GI status (bowel sounds, nausea/vomiting, distention, flatus);Administer stool softener as prescribed     Goal: Neurovascular Status is Stable  Outcome: Progressing   06/19/16 2317   Goal/Interventions addressed this shift   Neurovascular status is stable  Assess and document plantar/dorsiflexion;Monitor/assess neurovascular status (pulses, capillary refill, pain, paresthesia, presence of edema);VTE prevention: administer anticoagulant(s) and/or apply anti-embolism  stockings/devices as ordered     Goal: Hemodynamic Stability  Outcome: Progressing   06/19/16 2317   Goal/Interventions addressed this shift   Hemodynamic stability  Monitor/assess vital signs;Maintain temperature within desired parameters;Monitor/assess lab values and report abnormal values     Goal: Mobility/activity is maintained at optimum level for patient  Outcome: Progressing   06/19/16 2317   Goal/Interventions addressed this shift   Mobility/activity is maintained at optimal level for patient Evaluate if patient comfort function goal is met;Teach/review/reinforce hip precautions with patient/patient care companion;Teach/review/reinforce exercises (ankle pumps, quad sets, gluteal sets);Ambulate more independently     Goal: Pain at adequate level as identified by patient  Outcome: Progressing   06/19/16 2317   Goal/Interventions addressed this shift   Pain at adequate level as identified by patient  Identify patient comfort function goal;Evaluate if patient comfort function goal is met;Administer oral pain medications as prescribed       Problem: Moderate/High Fall Risk Score >5  Goal: Patient will remain free of falls  Outcome: Progressing      Comments: Ax4, family was at the bedside, lungs clear. Call bell & table within reach, adequate lighting, floor mat, and frequently rounded. Pt has mild to moderate pain to the R hip, Percocet 2 tab given, pt stated that pain level decreased, resting at this time. Dsg to the R hip, clean and intact. Encouraged the use of IS. SCD on. OOB to chair and ambulates to the BR with walker and stand by assist. Plan of care, continue to monitor pain and NV status.

## 2016-06-19 NOTE — Plan of Care (Signed)
Problem: Hip Surgery  Goal: Free from Infection  Outcome: Progressing   06/19/16 0142   Goal/Interventions addressed this shift   Free from infection Monitor/assess vital signs   Afebrile.  Pt got OOB to bathroom to try to move his bowel but unsuccessful.  He will try prune juice in the day time.  Encourage 3Fs.    Goal: Nutritional Intake is Adequate  Outcome: Progressing   06/19/16 0142   Goal/Interventions addressed this shift   Nutritional intake is adequate Assess GI status (bowel sounds, nausea/vomiting, distention, flatus)   Pt tried to move his bowel but unsuccessful.  He walked to BR but was unable to stand up straight.  He was slightly bent.    Goal: Neurovascular Status is Stable  Outcome: Progressing   06/19/16 0142   Goal/Interventions addressed this shift   Neurovascular status is stable  Monitor/assess neurovascular status (pulses, capillary refill, pain, paresthesia, presence of edema);VTE prevention: administer anticoagulant(s) and/or apply anti-embolism stockings/devices as ordered   NVI.  Good pedal pulses bilat.  No numbness or tingling.  Good, strong dorseflex and plantar flex.    Goal: Hemodynamic Stability  Outcome: Progressing   06/19/16 0142   Goal/Interventions addressed this shift   Hemodynamic stability   Monitor/assess vital signs   BP was slightly low. SBP 100.  Held BP med.  Will monitor am lab.  Goal: Mobility/activity is maintained at optimum level for patient  Outcome: Progressing   06/19/16 0142   Goal/Interventions addressed this shift   Mobility/activity is maintained at optimal level for patient Evaluate if patient comfort function goal is met   Pt knew about the hip precaution.  Was able to walk to BR to void but had no bm.    Goal: Pain at adequate level as identified by patient  Outcome: Progressing   06/19/16 0142   Goal/Interventions addressed this shift   Pain at adequate level as identified by patient  Identify patient comfort function goal   Pt's pain was controlled with  2 percocet.  Walked to BR with medium assistance.  Pain level went down to 5/10 after pain med.  Will continue to monitor any discomfort.  Goal: Address patient self-management plan  Outcome: Progressing   06/19/16 0142   Goal/Interventions addressed this shift   Address patient self-management plan  Promote lifestyle changes that support self-management activities   Pt's son from [place] will assist pt after d/c from hospital.  He stopped by to visit pt.  Wife was also here.  She left with her 63 years old granddaughter later on.  Goal: Patient/Patient Care Companion demonstrates understanding of disease process, treatment plan, medications, and discharge plan  Outcome: Progressing   06/19/16 0142   Goal/Interventions addressed this shift   Patient/patient care companion demonstrates understanding of disease process, treatment plan, medications, and discharge plan  Orient to unit   Pt was oriented to his room & was awared of hip precaution.  Maintained hip precaution while walking to BR & getting OOB.

## 2016-06-19 NOTE — PT Progress Note (Signed)
Physical Therapy Note    [facility]   Physical Therapy Treatment  Patient:  Jesus Bowman MRN#:  [id number]  Unit: [facility] TOWER 8  Bed: F825/F825.01      Pt declined AMB and therex with Rehab Tech in pm 2/2 not feeling well.   Carron Brazen, DPT  Pager # [phone]

## 2016-06-19 NOTE — Plan of Care (Signed)
Problem: Hip Surgery  Goal: Free from Infection  Outcome: Progressing  Vital sign stable. Neuro intact.     Goal: Mobility/activity is maintained at optimum level for patient  Outcome: Progressing  Patient walked to the bathroom, got up to the chair with one person assistant.   Goal: Pain at adequate level as identified by patient  Outcome: Progressing  Percocet 2 tablets given for the pain control.     Problem: Moderate/High Fall Risk Score >5  Goal: Patient will remain free of falls  Outcome: Progressing      Comments: Patient is AO X4.  Vital sign stable, afebrile.   Post op 1 of hip arthroplasty.   Dressing with ABD/tegaderm, c/d/i.    Voided through the urinal.   Last BM last Sunday, gave stool softner this am with prune juice.   Telemetry cont pulse ox d/c due to patient saturating 97-98% with Room air. PA Huntley Dec aware.   Will continue to monitor.

## 2016-06-19 NOTE — Progress Notes (Signed)
Post OP Day 1 Day Post-Op for Procedure(s):  ARTHROPLASTY, HIP TOTAL     Patient without significant complaints. Pain reasonably well controlled with PO Percocet. No IV medications required post-op. Tolerating PO without N/V. Voiding adequately. Patient did not arrive to the floor until later yesterday evening, which he feels is contributing to his slower progress today. However, the patient ambulating with PT successfully including stair management. Patient is not comfortable going home today.     Patient has a significant orthopedic history including spine surgery, but has continued low back problems. He has also had issues with pain control in the past and was taking Percocet prior to this recent THR. This is absolutely a requirement for inpatient status. The order was changed without Dr. Bonnetta Barry permission. He is currently trying to find out an explanation regarding this.     Vitals:    06/19/16 1104   BP: 127/60   Pulse: 78   Resp: 16   Temp: (!) 96.7 F (35.9 C)   SpO2: 95%       Intake/Output Summary (Last 24 hours) at 06/19/16 1224  Last data filed at 06/19/16 0600   Gross per 24 hour   Intake                0 ml   Output             2075 ml   Net            -2075 ml       Exam:    Awake and alert.  Oriented X 3    Dressing clean, dry and intact. No active drainage or signs of infection.    Calves soft, non tender.  Negative Homans.      Recent Labs  Lab 06/19/16  0227   WBC 6.89   Hgb 10.6*   Hematocrit 33.5*         Recent Labs  Lab 06/19/16  0227   Sodium 137   Potassium 4.5   Chloride 103   CO2 25   BUN 14.0   Creatinine 1.1   Glucose 127*   Calcium 8.5           Assessment:    Satisfactory post-op course status post Procedure(s):  ARTHROPLASTY, HIP TOTAL     Plan:    Continue per clinical pathway for Procedure(s):  ARTHROPLASTY, HIP TOTAL       1. Patient stable post op day #1.  2. Continue to monitor pain level. Using Percocet PRN.  3. Continue to mobilize further with PT.  4. Patient will be  evaluated tomorrow AM with probable discharge.    D/C HOME WITH HHPT AND RW (?)

## 2016-06-19 NOTE — Progress Notes (Signed)
Asked pt's wife and pt to find the advance directive so that we can place a copy in chart.  Wife said she will try to look for the copy.  She asked her husband to send her text message.

## 2016-06-19 NOTE — OT Eval Note (Signed)
[facility]   Occupational Therapy Evaluation     Patient: Jesus Bowman    MRN#: [id number]   Unit: [facility] TOWER 8  Bed: F825/F825.01                                     Discharge Recommendations:   Discharge Recommendation: Home with supervision (and assistance from family for higher level adls)     DME Recommended for Discharge:  (in place)        Assessment:   Jesus Bowman is a 61 y.o. male admitted 06/18/2016. Patient presents with decreased adl I, Functional mobility, activity tolerance and endurance. Patient will benefit from skilled OT to maximize adls.    Impairments: Assessment: decreased independence with ADLs    Therapy Diagnosis: decreased adl I    Rehabilitation Potential: Prognosis: Good;With continued OT s/p acute discharge     Treatment Activities: OT Eval, adl re training, functional transfer traiing  Educated the patient to role of occupational therapy, plan of care, goals of therapy and HEP, safety with mobility and ADLs.    Plan:   OT Frequency Recommended: 4-5x/wk     Treatment Interventions: ADL retraining;Functional transfer training;Patient/Family training;Equipment eval/education     Risks/benefits/POC discussed with patient         Precautions and Contraindications:   Precautions  Weight Bearing Status: RLE WBAT  Total Hip Replacement: no flexion past 90 degrees, no crossing legs, no ADDuction, no internal rotation  Precaution Instructions Given to Patient: Yes  Other Precautions: falls, obesity      Consult received for [name] for OT Evaluation and Treatment.  Patient's medical condition is appropriate for Occupational Therapy intervention at this time.    Admitting Diagnosis: Primary osteoarthritis of right hip [M16.11]      History of Present Illness:    Jesus Bowman is a 61 y.o. male admitted on 06/18/2016 with elective R THR      Past Medical/Surgical History:  Past Medical History:   Diagnosis  Date   . Abnormal vision     glasses   . Calculus of kidney 2007    One episode   . Congenital mitral insufficiency     Per [facility] & [facility]  November 09, 2014, pt denies   . Difficulty in walking(719.7)     stiffness-uses cane occasionally   . GERD (gastroesophageal reflux disease)     uses baking soda or alka seltzer prn - infrequent   . History of abscessed tooth 06/07/2016    root canal and amoxicillin    . Hyperlipidemia     medically managed   . Hypertension     under control with medication   . Low back pain     related to sitting - will require additional surgery in future   . Osteoarthrosis     right hip   . Rash     chronic groin rash    . Snoring     no sleep study   . Spinal stenosis    . Tinnitus    . Urinary incontinence     Per [facility]  November 09, 2014  Dr. Milagros Evener      Past Surgical History:   Procedure Laterality Date   . ARTHROPLASTY, HIP TOTAL Right 06/18/2016    Procedure: ARTHROPLASTY, HIP TOTAL;  Surgeon: Desmond Dike  Larkin Ina, MD;  Location: [facility];  Service: Orthopedics;  Laterality: Right;  RIGHT TOTAL HIP ARTHROPLASTY   . CARPAL TUNNEL RELEASE Bilateral 2003, 2008    left 2008, right 2003   . COLONOSCOPY  09/2010    multiple   . COLONOSCOPY N/A 12/27/2015    Procedure: COLONOSCOPY;  Surgeon: Halina Andreas, MD;  Location: [facility];  Service: Gastroenterology;  Laterality: N/A;  Colonoscopy  Q1-Unk    . EYE SURGERY Left March 30, 2013     Laser to repair torn retina    . JOINT REPLACEMENT Left 2010    left hip   . LAMINECTOMY, POSTERIOR LUMBAR, DECOMP, FUSION, LEVEL 2 N/A 05/31/2015    Procedure: LAMINECTOMY, POSTERIOR LUMBAR, DECOMP, FUSION, LEVEL 2;  Surgeon: Clelia Schaumann, MD;  Location: [facility];  Service: Orthopedics;  Laterality: N/A;  L3-L5 LAMINECTOMY, L4-L5 FUSION WITH INFUSE   . REMOVAL, POSTERIOR LUMBAR SPINE HARDWARE N/A 11/18/2014    Procedure: REMOVAL, POSTERIOR LUMBAR SPINE HARDWARE;  Surgeon: Clelia Schaumann, MD;  Location: [facility];  Service:  Orthopedics;  Laterality: N/A;  L4-L5 REMOVAL OF HARDWARE   . SPINE SURGERY  2006,2011,2012    laminectomy 2006 & 2011, fusion 2012         Imaging/Tests/Labs:  Xr Hip Right Ap And Lateral (including Prosthesis)    Result Date: 06/18/2016   Status post right hip arthroplasty. [name], MD 06/18/2016 1:24 PM      Social History:   Prior Level of Function:  Prior level of function: Independent with ADLs, Ambulates independently  Assistive Device: Front wheel walker  Baseline Activity Level: Community ambulation  DME Currently at Home: Single point cane, Front wheel walker Public relations account executive, RTS)    Home Living Arrangements:  Living Arrangements: Spouse/significant other  Type of Home: House  Home Layout: Multi-level  Bathroom Shower/Tub: Medical sales representative: Raised  Bathroom Equipment: Grab bars in shower  DME Currently at Home: Single point cane, Front wheel walker (Shower chair, RTS)      Subjective:I am happy to be able to dress     Patient is agreeable to participation in the therapy session.     Pain:denies pain    Patient goal- home                Objective:        Observation of Patient/Vital Signs:  Patient is in bed with dressings, SCD's and peripheral IV in place.    Cognitive Status and Neuro Exam:        intact         Musculoskeletal Examination  Gross ROM  Right Upper Extremity ROM: within functional limits  Left Upper Extremity ROM: within functional limits    Gross Strength  Right Upper Extremity Strength: within functional limits  Left Upper Extremity Strength: within functional limits              Sensory/Oculomotor Examination     Touch- intact  Vision- intact       Activities of Daily Living  Self-care and Home Management  Eating: Setup  Grooming: setup  Bathing: Minimal Assist  UB Dressing: set up  LB Dressing: Minimal Assist  Toileting: Minimal Assist  Functional Transfers: Minimal Assist    Functional Mobility:  Mobility and Transfers  Supine to Sit: Minimal Assist  Sit to Supine:  Minimal Assist  Sit to Stand: Minimal Assist  Bed to Chair: Minimal Assist  PMP Activity: Step 6 - Walks in Room     Balance  Balance  Static Sitting Balance: good  Dyanamic Sitting Balance: fair    Participation and Activity Tolerance  Participation and Endurance  Participation Effort: good  Endurance: Tolerates 10 - 20 min exercise with multiple rests    Patient left with call bell within reach, all needs met, SCDs on, fall mat on, bed alarm on, chair alarm on and all questions answered. RN notified of session outcome and patient response.       Goals:  Time For Goal Achievement: 3 visits  ADL Goals  Patient will dress lower body: Modified Independent  Patient will toilet: Stand by Assist  Mobility and Transfer Goals  Pt will perform functional transfers: Stand by Assist                                Time of treatment:   OT Received On: 06/19/16  Start Time: 0840  Stop Time: 0918  Time Calculation (min): 38 min          Tora Perches OTR/L  Pager [phone]

## 2016-06-19 NOTE — Progress Notes (Signed)
06/19/16 0913   Patient Type   Within 30 Days of Previous Admission? No   Healthcare Decisions   Interviewed: Patient   English as a second language teacher Information: [phone] cell   Orientation/Decision Making Abilities of Patient Alert and Oriented x3, able to make decisions   Advance Directive Patient has advance directive, copy not in chart   Advance Directive not in Chart Copy requested from family/decision maker   Healthcare Agent Appointed Yes   Healthcare Agent's Name Doniven Vanpatten, wife   Healthcare Agent's Phone Number [phone] home/ [phone] cell   Additional Emergency Contacts? NA   Prior to admission   Prior level of function Independent with ADLs;Ambulates independently   Type of Residence Private residence   Home Layout Multi-level   Have running water, electricity, heat, etc? Yes   Living Arrangements Spouse/significant other   How do you get to your MD appointments? self   How do you get your groceries? self   Who fixes your meals? self   Who does your laundry? self   Who picks up your prescriptions? self   Dressing Independent   Grooming Independent   Feeding Independent   Bathing Independent   Toileting Independent   DME Currently at Home Single point cane   Name of Prior Assisted Living Facility NA   Home Care/Community Services (NA)   Prior SNF admission? (Detail) NA   Prior Rehab admission? (Detail) NA   Adult Protective Services (APS) involved? No   Discharge Planning   Support Systems Spouse/significant other;Family members   Patient expects to be discharged to: home   Anticipated [place] plan discussed with: Same as interviewed    discussion contact information: [phone]   Potential barriers to discharge: (NA)   Mode of transportation: Private car (family member)   Consults/Providers   PT Evaluation Needed 1   OT Evalulation Needed 1   SLP Evaluation Needed 2   Outcome Palliative Care Screen (NA)   Correct PCP listed in Epic? Yes   Important Message from [facility] Notice   Patient  received 1st IMM Letter? Yes     Renay Crammer  Care Coordiantor  [facility]  Spectralink [phone]

## 2016-06-19 NOTE — UM Notes (Signed)
06/18/16 1408  Admit to Inpatient        Operative Procedure:   Procedure(s):  ARTHROPLASTY, HIP TOTAL    Preoperative Diagnosis:   Pre-Op Diagnosis Codes:     * Primary osteoarthritis of right hip [M16.11]    Postoperative Diagnosis:   Post-Op Diagnosis Codes:     * Primary osteoarthritis of right hip [M16.11]    PT recs: Discharge Recommendation: Home with home health PT, Home with supervision   DME Recommendation: DME Recommended for Discharge: Front wheel walker      vs-97.6, 77, 16, 138/75, 99% 6L O2 nc, 98% 3L O2 nc,     NPO for OR, iv ancef preop, then q8h, ivf @ 100, iv fentanyl prn x 4, iv dilaudid prn x 4, telemetry, pt/ot eval, reg diet,     Cordelia Pen MSN, [employer], ACM  Utilization Review Case Manager  [employer]  [phone]

## 2016-06-20 ENCOUNTER — Other Ambulatory Visit: Payer: Self-pay

## 2016-06-20 LAB — BASIC METABOLIC PANEL
BUN: 19 mg/dL (ref 9.0–28.0)
CO2: 23 mEq/L (ref 21–29)
Calcium: 8.5 mg/dL (ref 8.5–10.5)
Chloride: 102 mEq/L (ref 100–111)
Creatinine: 1.2 mg/dL (ref 0.5–1.5)
Glucose: 113 mg/dL — ABNORMAL HIGH (ref 70–100)
Potassium: 4.6 mEq/L (ref 3.5–5.1)
Sodium: 133 mEq/L — ABNORMAL LOW (ref 136–145)

## 2016-06-20 LAB — CBC
Absolute NRBC: 0 10*3/uL
Hematocrit: 29.6 % — ABNORMAL LOW (ref 42.0–52.0)
Hgb: 9.4 g/dL — ABNORMAL LOW (ref 13.0–17.0)
MCH: 29.2 pg (ref 28.0–32.0)
MCHC: 31.8 g/dL — ABNORMAL LOW (ref 32.0–36.0)
MCV: 91.9 fL (ref 80.0–100.0)
MPV: 10.1 fL (ref 9.4–12.3)
Nucleated RBC: 0 /100 WBC (ref 0.0–1.0)
Platelets: 222 10*3/uL (ref 140–400)
RBC: 3.22 10*6/uL — ABNORMAL LOW (ref 4.70–6.00)
RDW: 13 % (ref 12–15)
WBC: 7.53 10*3/uL (ref 3.50–10.80)

## 2016-06-20 LAB — LAB USE ONLY - HISTORICAL SURGICAL PATHOLOGY

## 2016-06-20 LAB — HEMOLYSIS INDEX: Hemolysis Index: 82 — ABNORMAL HIGH (ref 0–18)

## 2016-06-20 LAB — GFR: EGFR: >60

## 2016-06-20 NOTE — Progress Notes (Addendum)
[facility] [phone] (F) [phone]  Rolling walker order sent via All[employer] and faxed in directly     06/22/2016 12:36 PM: Per Josh at [facility] they are delivering a rollator to patient today      [facility] (P) [phone]  CPM order sent via [employer]

## 2016-06-20 NOTE — OT Progress Note (Signed)
Occupational Therapy Note    [facility]   Occupational Therapy Treatment     Patient: Jesus Bowman    MRN#: [id number]   Unit: [facility] TOWER 8  Bed: F825/F825.01      Discharge Recommendations:   Discharge Recommendation: Home with supervision (and assistance from family for higher level adls)     DME Recommended for Discharge:  (in place)      Assessment:   Tolerated session well. Patient presents sitting up in bed. Agreeable to participate. Per patient he is aware of how to use reacher and other A.E for adls as he has had several back sx's. He also completed car transfers yesterday and refused to re- attempt. SBA With bed mobility and functional ambulation to BR with RW. Reviewed all safety precautions with adls. No further acute OT needs. D/C OT    Treatment Activities: Adl re training, functional transfer training    Educated the patient to role of occupational therapy, plan of care, goals of therapy and HEP, safety with mobility and ADLs.    Plan:       Discharge from OT Acute Care Services.       Precautions and Contraindications:   Falls  Weight Bearing Status: RLE WBAT  Total Hip Replacement: no flexion past 90 degrees, no crossing legs, no ADDuction, no internal rotation  Precaution Instructions Given to Patient: Yes  Other Precautions: falls, obesity      Updated Medical Status/Imaging/Labs:  reivewed    Subjective:I am ready to get OOB   Patient's medical condition is appropriate for Occupational Therapy intervention at this time.  Patient is agreeable to participation in the therapy session.    Pain:   Scale: 2/10  Location: R le  Intervention: pre medicated vis nsg    Objective:   Patient is in bed with dressings, SCD's and peripheral IV in place.      Cognition  intact    Functional Mobility  Rolling:  nt  Supine to Sit: min a  Sit to Stand: min a  Transfers: min a    PMP Activity: Step 6 - Walks in Room    Balance  Static Sitting: good  Dynamic Sitting:  fair  Static Standing: fair  Dynamic Standing: fair    Self Care and Home Management  Eating: set up  Grooming: set up  Bathing: min a  UE Dressing: set up  LE Dressing: SBA  Toileting: mina    Therapeutic Exercises  With actiivty    Participation: good  Endurance: fair    Patient left with call bell within reach, all needs met, SCDs on, fall mat on, bed alarm off, chair alarm off and all questions answered. RN notified of session outcome and patient response.     Goals:met at a simulated level  Time For Goal Achievement: 3 visits  ADL Goals  Patient will dress lower body: Modified Independent  Patient will toilet: Stand by Assist  Mobility and Transfer Goals  Pt will perform functional transfers: Stand by Assist                             Time of Treatment  OT Received On: 06/20/16  Start Time: 0835  Stop Time: 0900  Time Calculation (min): 25 min    Treatment # 1     Tora Perches OTR/L  Pager [phone]

## 2016-06-20 NOTE — Plan of Care (Signed)
Problem: Safety  Goal: Patient will be free from injury during hospitalization  Outcome: Completed Date Met: 06/20/16  Pt oriented times 4 stable, safety precaution maintained and continue monitoring.    Problem: Psychosocial and Spiritual Needs  Goal: Demonstrates ability to cope with hospitalization/illness  Outcome: Completed Date Met: 06/20/16      Problem: Hip Surgery  Goal: Free from Infection  Outcome: Completed Date Met: 06/20/16    Goal: Nutritional Intake is Adequate  Outcome: Completed Date Met: 06/20/16    Goal: Neurovascular Status is Stable  Outcome: Completed Date Met: 06/20/16    Goal: Hemodynamic Stability  Outcome: Completed Date Met: 06/20/16    Goal: Mobility/activity is maintained at optimum level for patient  Outcome: Completed Date Met: 06/20/16    Goal: Pain at adequate level as identified by patient  Outcome: Completed Date Met: 06/20/16  Pain meds given as needed  Goal: Address patient self-management plan  Outcome: Completed Date Met: 06/20/16    Goal: Patient/Patient Care Companion demonstrates understanding of disease process, treatment plan, medications, and discharge plan  Outcome: Completed Date Met: 06/20/16      Problem: Moderate/High Fall Risk Score >5  Goal: Patient will remain free of falls  Outcome: Completed Date Met: 06/20/16  Pt ambulate with walker safety precaution maintained and continue monitoring.    Comments: Pt has d/c home order, d/c plan in progress.

## 2016-06-20 NOTE — PT Progress Note (Signed)
Physical Therapy Note    [facility]   Physical Therapy Treatment  Patient:  Jesus Bowman MRN#:  [id number]  Unit: [facility] TOWER 8  Bed: F825/F825.01    Discharge Recommendations:   D/C Recommendations: Home with supervision, Home with home health PT   DME Recommendations: Front wheel walker         Assessment:   Pt continued to make good progress toward goals. Still with gait impairment as he has a habit of leaning fwd and extending hips posterior.  Pt requested to complete stairs again. He did well and is now SPV on stairs.  Continue to recommend home with HHS PT and FWW.    Assessment: Decreased LE ROM, Decreased LE strength, Decreased endurance/activity tolerance, Decreased functional mobility, Decreased balance, Gait impairment  Progress: Progressing toward goals  Prognosis: Good, With continued PT status post acute discharge  Risks/Benefits/POC Discussed with Pt/Family: With patient  Patient left without needs and call bell within reach. RN notified of session outcome.     Treatment Activities: gait training, transfer training, d/c planning, therex/hep, d/c planning, pt education/training, stair training.    Educated the patient to role of physical therapy, plan of care, goals of therapy and HEP, safety with mobility and ADLs, hip precautions, weight bearing precautions, discharge instructions, home safety.    Plan:   Treatment/Interventions: Exercise, Gait training, Stair training, Neuromuscular re-education, Functional transfer training, LE strengthening/ROM, Endurance training, Patient/family training, Equipment eval/education, Bed mobility, Compensatory technique education, Continued evaluation        PT Frequency: 4-5x/wk     Continue plan of care.       Precautions and Contraindications:   Precautions  Weight Bearing Status: RLE WBAT  Total Hip Replacement: no crossing legs;no internal rotation;no ADDuction;no flexion past 90 degrees  Precaution Instructions  Given to Patient: Yes  Other Precautions: falls, obesity    Updated Medical Status/Imaging/Labs:   Lab Results   Component Value Date/Time    HGB 9.4 (L) 06/20/2016 04:01 AM    HCT 29.6 (L) 06/20/2016 04:01 AM    K 4.6 06/20/2016 04:01 AM    NA 133 (L) 06/20/2016 04:01 AM         Subjective:        Pain Assessment  Pain Assessment: Numeric Scale (0-10)  Pain Score: 6-moderate pain  POSS Score: Awake and Alert  Pain Location: Hip;Leg  Pain Orientation: Right  Pain Descriptors: Sore  Pain Frequency: Increases with movement;Constant/continuous  Effect of Pain on Daily Activities: moderate  Pain Intervention(s): Medication (See eMAR);Repositioned;Elevated;Rest  Multiple Pain Sites: No    Patient's medical condition is appropriate for Physical Therapy intervention at this time.  Patient is agreeable to participation in the therapy session. Nursing clears patient for therapy.    Objective:   Observation of Patient/Vital Signs:  Patient is seated in a bedside chair with dressings, SCD's and peripheral IV in place.    Cognition/Neuro Status  Arousal/Alertness: Appropriate responses to stimuli  Attention Span: Appears intact  Orientation Level: Oriented X4  Memory: Appears intact  Following Commands: Follows multistep commands with increased time;Follows multistep commands with repetition  Safety Awareness: independent  Insights: Educated in Engineer, building services;Fully aware of deficits  Problem Solving: Able to problem solve independently  Behavior: attentive;calm;cooperative  Motor Planning: intact  Coordination: intact  Orientation Level: Oriented X4         Functional Mobility:  Supine to Sit: Unable to assess (Comment) (2/2 pt up in chair)  Sit to Supine: Unable to assess (Comment) (2/2 pt requested back to chair)  Sit to Stand: Supervision  Stand to Sit: Supervision  Transfers  Bed to Chair: Supervision  Chair to Bed: Supervision  Device Used for Functional Transfer: front-wheeled walker    Ambulation:  PMP - Progressive  Mobility Protocol   PMP Activity: Step 7 - Walks out of Room  Distance Walked (ft) (Step 6,7): 175 Feet (x2)     Ambulation: Supervision;with front-wheeled walker  Pattern: R decreased stance time;decreased cadence (forward flexed posture)  Stair Management: Stand by Assist;Supervision;one rail L;step to pattern;forward;with cane  Number of Stairs: 10 (up/down 2x6" and 3x4")    Therapeutic Exercise  Quad Sets: 10  Heelslides: 10 (min A)  Glute Sets: 10  Hip Abduction: 10 (cga/min A)  Knee AROM Short Arc Quad: 10  Ankle Pumps: 10               Patient Participation: good  Patient Endurance: fair+    Patient left with call bell within reach, all needs met, SCDs placed, fall mat none in room, bed alarm n/a, chair alarm n/a (not a high fall risk) and all questions answered. RN notified of session outcome and patient response.     Goals:  Goals  Goal Formulation: With patient  Time for Goal Acheivement: 3 visits  Goals: Select goal  Pt Will Go Supine To Sit: with supervision, Partly met  Pt Will Transfer Bed/Chair: with rolling walker, with supervision, Goal met  Pt Will Ambulate: 151-200 feet, with rolling walker, with supervision, Goal met  Pt Will Go Up / Down Stairs: 1 flight, with minimal assist, With rail, With [facility], Goal met      Carron Brazen, DPT  Pager # [phone]      Time of Treatment  PT Received On: 06/20/16  Start Time: 1045  Stop Time: 1139  Time Calculation (min): 54 min  Treatment # 2 out of 3 visits

## 2016-06-20 NOTE — Progress Notes (Signed)
Post OP Day 2 Days Post-Op for Procedure(s):  ARTHROPLASTY, HIP TOTAL     Patient without significant complaints. Pain reasonably well controlled with PO Percocet. No IV medications required. Of note, the patient was taking Percocet prior to the surgery. Tolerating PO without N/V. Voiding adequately. Ambulating with PT successfully including stair management. Patient is eager to go home today.    Vitals:    06/20/16 0944   BP: 106/52   Pulse:    Resp:    Temp:    SpO2:        Intake/Output Summary (Last 24 hours) at 06/20/16 1055  Last data filed at 06/20/16 0600   Gross per 24 hour   Intake              800 ml   Output                0 ml   Net              800 ml       Exam:    Awake and alert.  Oriented X 3    Dressing clean, dry and intact. Dressing changed today. No active drainage or signs of infection.    Calves soft, non tender.  Negative Homans.      Recent Labs  Lab 06/20/16  0401   WBC 7.53   Hgb 9.4*   Hematocrit 29.6*         Recent Labs  Lab 06/20/16  0401   Sodium 133*   Potassium 4.6   Chloride 102   CO2 23   BUN 19.0   Creatinine 1.2   Glucose 113*   Calcium 8.5           Assessment:    Satisfactory post-op course status post Procedure(s):  ARTHROPLASTY, HIP TOTAL     Plan:    Continue per clinical pathway for Procedure(s):  ARTHROPLASTY, HIP TOTAL     1. Patient stable post op day #2.  2. Continue to monitor pain level.  3. Continue to ambulate prior to discharge including a PT session.  4. Dressing changed and demonstrated today.  5. All post op and follow up instructions discussed with the patient in detail. Percocet and Lovenox prescription placed in patient's chart.  6. Patient is safe and medically cleared for discharge today.     D/C HOME WITH HHPT AND RW

## 2016-06-20 NOTE — Progress Notes (Signed)
Discharged instructions given verbalized of understanding, all belongings with patients

## 2016-06-20 NOTE — Discharge Summary (Signed)
Discharge Date:      ATTENDING PHYSICIAN:  Desmond Dike, MD     DATE OF ADMISSION:   June 18, 2016      DATE OF DISCHARGE:   June 20, 2016      HOSPITAL COURSE:    The patient is a 61 year old gentleman admitted status post right total hip  arthroplasty.  He was admitted after a total hip knowing that the patient  needed significant observation due to  considerable difficulties from  failed back surgery and difficulties after back surgery.  Once this patient  was admitted, apparently, the nurse took it upon herself to transfer him to  outpatient status and then with some discussion was transferred back the  next day to inpatient status.  He will be discharged today, June 20, 2016,  on Lovenox, Percocet as needed for pain, and weightbearing as tolerated.   No complications were encountered.  He was mobilized.  He still has  difficulties with his back and legs, which will be addressed by Dr. Amaryllis Dyke.  The hip itself showed increased marked destruction, which was  nicely managed with total hip arthroplasty.  Once this was accomplished, he  was mobilized and will be discharged as outlined.  The remainder of  historical, physical, and laboratory findings can be found in his chart and  in the medical clearance therein.           D:  06/20/2016 11:40 AM by Dr. Blanchard Kelch, MD [phone])  T:  06/20/2016 12:39 PM by       Everlean Cherry: [id number]) (Doc ID: [id number])

## 2016-06-20 NOTE — Progress Notes (Signed)
Pt discharged today with diagnosis of DJD right hip care and follow up explained.

## 2016-06-28 ENCOUNTER — Inpatient Hospital Stay: Admit: 2016-06-28 | Payer: Medicare Other | Admitting: Orthopaedic Surgery

## 2016-06-28 SURGERY — ARTHROPLASTY, HIP TOTAL
Anesthesia: General | Site: Pelvis | Laterality: Right

## 2016-08-01 ENCOUNTER — Inpatient Hospital Stay: Payer: Medicare (Managed Care)

## 2017-03-15 ENCOUNTER — Encounter (FREE_STANDING_LABORATORY_FACILITY): Payer: Medicare (Managed Care)

## 2017-03-15 DIAGNOSIS — K921 Melena: Secondary | ICD-10-CM

## 2017-03-20 LAB — LAB USE ONLY - HISTORICAL SURGICAL PATHOLOGY

## 2017-06-27 IMAGING — CR DG CHEST 1V PORT
1 series · 1 of 1 positions shown · non-contrast
Comparison: None.

CLINICAL DATA: Acute onset of generalized chest pain and shortness
of breath. Initial encounter.

EXAM:
PORTABLE CHEST 1 VIEW

[AP]
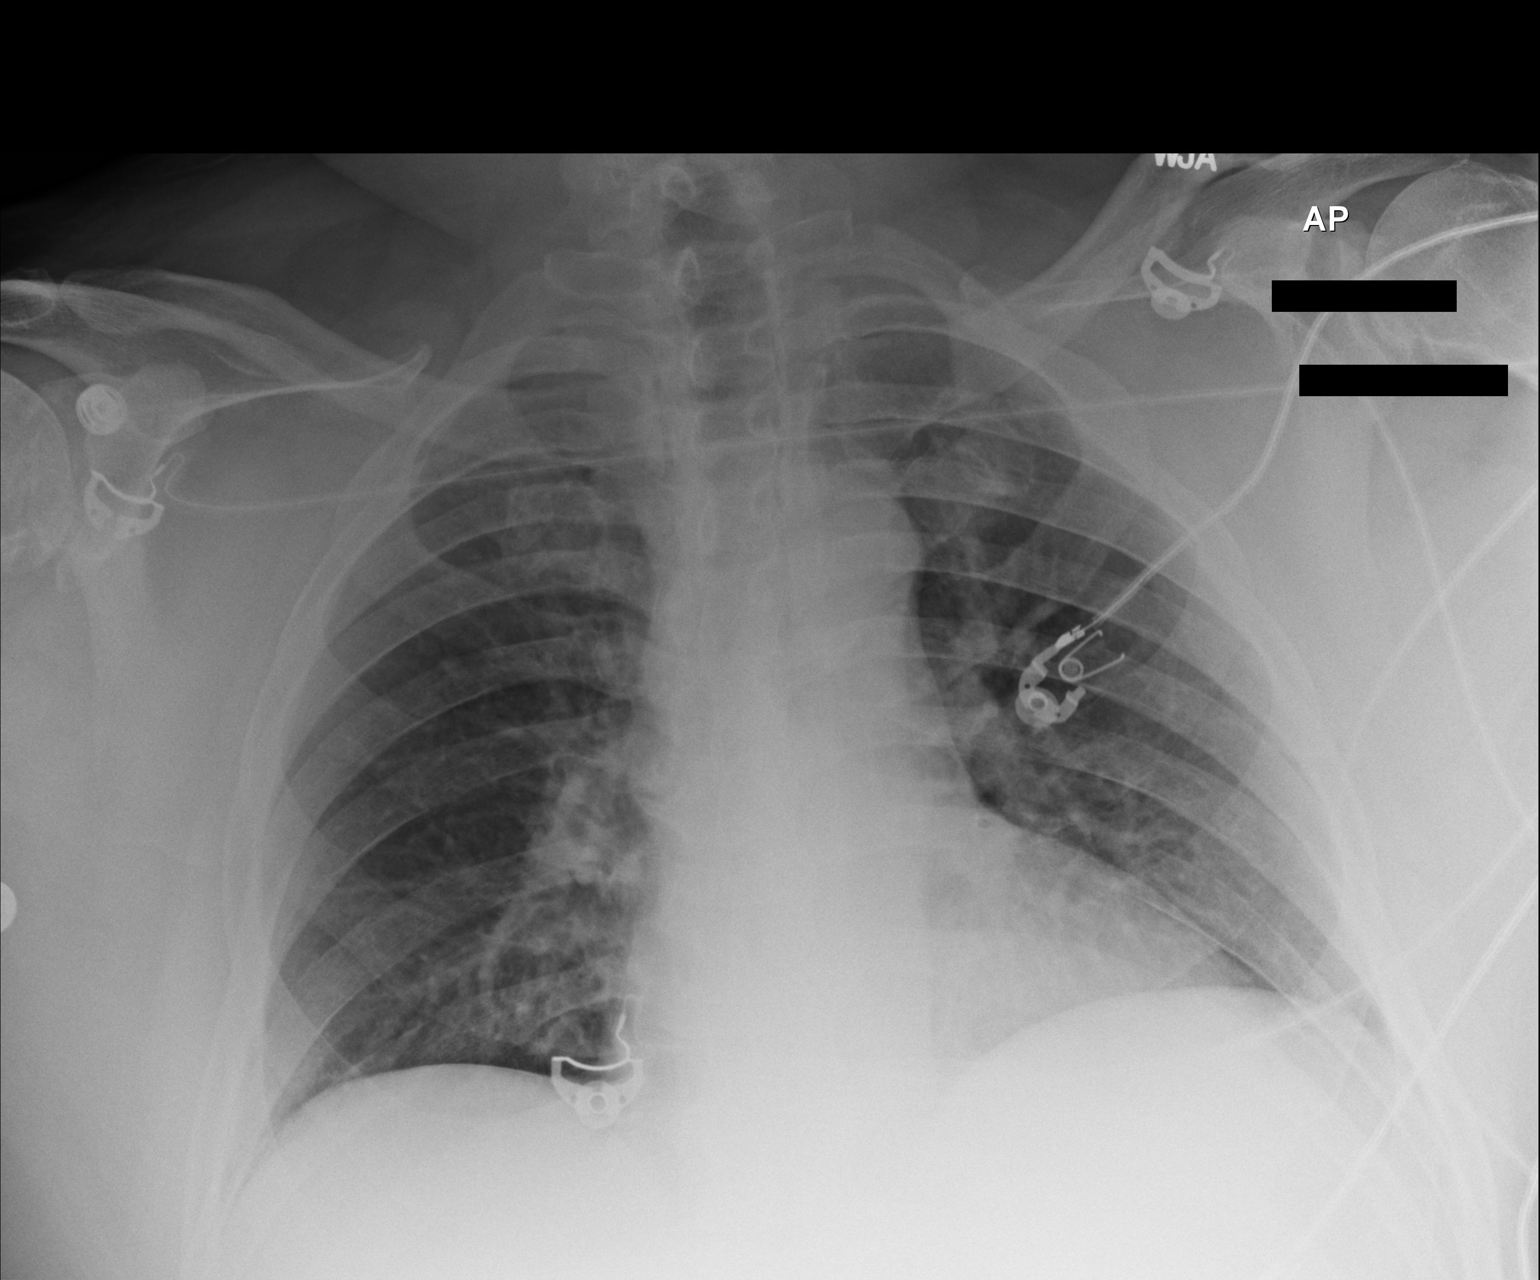

[1 of 1 positions shown; findings below may reference images not displayed]

FINDINGS: The lungs are mildly hypoexpanded but grossly clear. There is no
evidence of focal opacification, pleural effusion or pneumothorax.

The cardiomediastinal silhouette is within normal limits. No acute
osseous abnormalities are seen.
IMPRESSION: Lungs mildly hypoexpanded but grossly clear.

## 2017-12-04 ENCOUNTER — Encounter (INDEPENDENT_AMBULATORY_CARE_PROVIDER_SITE_OTHER): Payer: Self-pay

## 2018-01-01 ENCOUNTER — Encounter (INDEPENDENT_AMBULATORY_CARE_PROVIDER_SITE_OTHER): Payer: Self-pay

## 2018-01-22 ENCOUNTER — Encounter (INDEPENDENT_AMBULATORY_CARE_PROVIDER_SITE_OTHER): Payer: Self-pay

## 2018-02-26 ENCOUNTER — Encounter (INDEPENDENT_AMBULATORY_CARE_PROVIDER_SITE_OTHER): Payer: Self-pay

## 2018-03-23 ENCOUNTER — Encounter (INDEPENDENT_AMBULATORY_CARE_PROVIDER_SITE_OTHER): Payer: Self-pay

## 2018-04-23 ENCOUNTER — Encounter (INDEPENDENT_AMBULATORY_CARE_PROVIDER_SITE_OTHER): Payer: Self-pay

## 2018-05-23 ENCOUNTER — Encounter (INDEPENDENT_AMBULATORY_CARE_PROVIDER_SITE_OTHER): Payer: Self-pay

## 2018-06-23 ENCOUNTER — Encounter (INDEPENDENT_AMBULATORY_CARE_PROVIDER_SITE_OTHER): Payer: Self-pay

## 2018-07-23 ENCOUNTER — Encounter (INDEPENDENT_AMBULATORY_CARE_PROVIDER_SITE_OTHER): Payer: Self-pay

## 2018-08-23 ENCOUNTER — Encounter (INDEPENDENT_AMBULATORY_CARE_PROVIDER_SITE_OTHER): Payer: Self-pay

## 2018-09-23 ENCOUNTER — Encounter (INDEPENDENT_AMBULATORY_CARE_PROVIDER_SITE_OTHER): Payer: Self-pay

## 2018-10-23 ENCOUNTER — Encounter (INDEPENDENT_AMBULATORY_CARE_PROVIDER_SITE_OTHER): Payer: Self-pay

## 2018-11-23 ENCOUNTER — Encounter (INDEPENDENT_AMBULATORY_CARE_PROVIDER_SITE_OTHER): Payer: Self-pay

## 2019-03-19 ENCOUNTER — Other Ambulatory Visit: Payer: Self-pay | Admitting: Physician Assistant

## 2019-06-16 DIAGNOSIS — I208 Other forms of angina pectoris: Secondary | ICD-10-CM

## 2019-06-16 HISTORY — DX: Other forms of angina pectoris: I20.8

## 2019-08-27 ENCOUNTER — Other Ambulatory Visit: Payer: Self-pay | Admitting: Internal Medicine

## 2019-08-27 DIAGNOSIS — R079 Chest pain, unspecified: Secondary | ICD-10-CM

## 2019-09-08 ENCOUNTER — Other Ambulatory Visit: Payer: Medicare Other

## 2019-09-16 ENCOUNTER — Inpatient Hospital Stay
Admission: RE | Admit: 2019-09-16 | Discharge: 2019-09-16 | Disposition: A | Discharge status: Home / Self Care | Payer: Medicare Other | Source: Ambulatory Visit | Attending: Internal Medicine | Admitting: Internal Medicine

## 2019-09-16 DIAGNOSIS — I5189 Other ill-defined heart diseases: Secondary | ICD-10-CM | POA: Insufficient documentation

## 2019-09-16 DIAGNOSIS — I219 Acute myocardial infarction, unspecified: Secondary | ICD-10-CM | POA: Insufficient documentation

## 2019-09-16 DIAGNOSIS — R079 Chest pain, unspecified: Secondary | ICD-10-CM | POA: Insufficient documentation

## 2019-09-16 MED ORDER — REGADENOSON 0.4 MG/5ML IV SOLN
0.4000 mg | Freq: Once | INTRAVENOUS | Status: AC | PRN
Start: 2019-09-16 — End: 2019-09-16
  Administered 2019-09-16: 13:00:00 0.4 mg via INTRAVENOUS

## 2019-09-16 MED ORDER — TECHNETIUM TC 99M TETROFOSMIN IV KIT
10.0000 | PACK | Freq: Once | INTRAVENOUS | Status: AC | PRN
Start: 2019-09-16 — End: 2019-09-16
  Administered 2019-09-16: 12:00:00 10 via INTRAVENOUS
  Filled 2019-09-16: qty 100

## 2019-09-16 MED ORDER — REGADENOSON 0.4 MG/5ML IV SOLN
INTRAVENOUS | Status: AC
Start: 2019-09-16 — End: ?
  Filled 2019-09-16: qty 5

## 2019-09-16 MED ORDER — TECHNETIUM TC 99M TETROFOSMIN IV KIT
35.0000 | PACK | Freq: Once | INTRAVENOUS | Status: AC | PRN
Start: 2019-09-16 — End: 2019-09-16
  Administered 2019-09-16: 13:00:00 35 via INTRAVENOUS
  Filled 2019-09-16: qty 100

## 2019-09-17 LAB — NM MYOCARDIAL PERFUSION SPECT (STRESS AND REST): Nuclear LV Ejection Fraction: 51

## 2019-09-25 ENCOUNTER — Other Ambulatory Visit: Payer: Self-pay | Admitting: Internal Medicine

## 2019-09-29 ENCOUNTER — Telehealth (INDEPENDENT_AMBULATORY_CARE_PROVIDER_SITE_OTHER): Payer: Self-pay

## 2019-09-29 NOTE — Telephone Encounter (Signed)
Corrie Dandy, from PCP office requesting NST- to be faxed [phone], done.

## 2019-10-21 ENCOUNTER — Encounter (INDEPENDENT_AMBULATORY_CARE_PROVIDER_SITE_OTHER): Payer: Self-pay | Admitting: Cardiovascular Disease

## 2019-10-21 ENCOUNTER — Ambulatory Visit (INDEPENDENT_AMBULATORY_CARE_PROVIDER_SITE_OTHER): Payer: Medicare Other | Admitting: Cardiovascular Disease

## 2019-10-21 VITALS — BP 145/82 | HR 57 | Ht 67.0 in | Wt 262.0 lb

## 2019-10-21 DIAGNOSIS — I10 Essential (primary) hypertension: Secondary | ICD-10-CM

## 2019-10-21 DIAGNOSIS — E7801 Familial hypercholesterolemia: Secondary | ICD-10-CM

## 2019-10-21 DIAGNOSIS — I2510 Atherosclerotic heart disease of native coronary artery without angina pectoris: Secondary | ICD-10-CM

## 2019-10-21 DIAGNOSIS — R079 Chest pain, unspecified: Secondary | ICD-10-CM

## 2019-10-25 DIAGNOSIS — R079 Chest pain, unspecified: Secondary | ICD-10-CM | POA: Insufficient documentation

## 2019-10-25 DIAGNOSIS — I10 Essential (primary) hypertension: Secondary | ICD-10-CM | POA: Insufficient documentation

## 2019-10-25 DIAGNOSIS — I2510 Atherosclerotic heart disease of native coronary artery without angina pectoris: Secondary | ICD-10-CM | POA: Insufficient documentation

## 2019-10-25 DIAGNOSIS — E7801 Familial hypercholesterolemia: Secondary | ICD-10-CM | POA: Insufficient documentation

## 2019-10-25 NOTE — Progress Notes (Signed)
[facility]- Cardiology Consultation Note    Chief Complaint   Patient presents with    Abnormal nuclear stress test       Referring Physician: Renette Butters, MD     Date of Consultation: 10/21/2019      HPI: 64 year old gentleman referred for cardiac consultation because of an abnormal nuclear stress test.    The patient has no previous history of myocardial infarction, congestive heart failure or dysrhythmias.    He has risk factors for coronary disease that include hypercholesterolemia and hypertension.  There is no history of tobacco abuse, diabetes, or family history for premature coronary disease.    On extensive cardio review of systems,  he exercises about 1 time per week without any typical angina.  He has had however for 2 to 3 months this midepigastric type of this comfort possibly related to stress but not above sounding typical for angina.  Precipitants are not clear ameliorating factors are unclear.  Given his risk factor profile a nuclear stress test was recommended    We reviewed the results of his exercise nuclear study-      Summary    1. Abnormal stress and rest myocardial perfusion study with evidence of a  small infarct in the territory of the LCX. The perfusion defects still  visualized despite CT attenuation correction..    2. Gated wall motion study reveals very mild hypokinesis in the apex.    3. Gated wall motion study demonstrates low normal left ventricular function  with calculated ejection fraction 50 %.    4. Review of low dose CT demonstrate a Visually Estimated minimal Coronary  Artery Calcium Score (VECAC).    5. No prior studies are available for comparison.      SPECT Results  Image Corrections:     Attenuation correction applied - CT based  Perfusion Findings  Stress SPECT tomographic images reveal a small sized area of decreased  activity in the apical lateral wall which is mild in intensity and fixed on  rest images.    RV insertion artifact  noted.      Functional Findings    Gated wall motion study demonstrates low normal left ventricular function  with calculated ejection fraction 50 %.    Gated wall motion study reveals very mild hypokinesis in the apex.    There is no transient ischemic dilatation.      Review of Systems:  All other systems were reviewed and are negative unless stated above per HPI  Weight stable. No fatigue. No asthma, wheezing, or lung problem. No ulcer, gall bladder or reflux. No kidney problems. No thyroid history. Neurologic negative. No anemia or bleeding. No claudication. All other systems were reviewed and negative except as mentioned above.  Past Medical History:   Diagnosis Date    Abnormal vision     glasses    Calculus of kidney 2007    One episode    Congenital mitral insufficiency     Per [facility]  November 09, 2014, pt denies    Difficulty in walking(719.7)     stiffness-uses cane occasionally    GERD (gastroesophageal reflux disease)     uses baking soda or alka seltzer prn - infrequent    History of abscessed tooth 06/07/2016    root canal and amoxicillin     Hyperlipidemia     medically managed    Hypertension     under control with medication    Low back pain  related to sitting - will require additional surgery in future    Osteoarthrosis     right hip    Rash     chronic groin rash     Snoring     no sleep study    Spinal stenosis     Tinnitus     Urinary incontinence     Per [facility]  November 09, 2014  Dr. Milagros Evener        Past Surgical History:   Procedure Laterality Date    ARTHROPLASTY, HIP TOTAL Right 06/18/2016    Procedure: ARTHROPLASTY, HIP TOTAL;  Surgeon: Estevan Oaks, MD;  Location: [facility];  Service: Orthopedics;  Laterality: Right;  RIGHT TOTAL HIP ARTHROPLASTY    CARPAL TUNNEL RELEASE Bilateral 2003, 2008    left 2008, right 2003    COLONOSCOPY  09/2010    multiple    COLONOSCOPY N/A 12/27/2015    Procedure: COLONOSCOPY;  Surgeon: Halina Andreas, MD;  Location:  [facility];  Service: Gastroenterology;  Laterality: N/A;  Colonoscopy  Q1-Unk     EYE SURGERY Left March 30, 2013     Laser to repair torn retina     JOINT REPLACEMENT Left 2010    left hip    LAMINECTOMY, POSTERIOR LUMBAR, DECOMP, FUSION, LEVEL 2 N/A 05/31/2015    Procedure: LAMINECTOMY, POSTERIOR LUMBAR, DECOMP, FUSION, LEVEL 2;  Surgeon: Clelia Schaumann, MD;  Location: [facility];  Service: Orthopedics;  Laterality: N/A;  L3-L5 LAMINECTOMY, L4-L5 FUSION WITH INFUSE    REMOVAL, POSTERIOR LUMBAR SPINE HARDWARE N/A 11/18/2014    Procedure: REMOVAL, POSTERIOR LUMBAR SPINE HARDWARE;  Surgeon: Clelia Schaumann, MD;  Location: [facility];  Service: Orthopedics;  Laterality: N/A;  L4-L5 REMOVAL OF HARDWARE    SPINE SURGERY  2006,2011,2012    laminectomy 2006 & 2011, fusion 2012       Social History     Socioeconomic History    Marital status: Married     Spouse name: Not on file    Number of children: Not on file    Years of education: Not on file    Highest education level: Not on file   Occupational History    Not on file   Tobacco Use    Smoking status: Former Smoker     Packs/day: 1.00     Years: 33.00     Pack years: 33.00     Quit date: 12/01/1998     Years since quitting: 20.9    Smokeless tobacco: Never Used   Substance and Sexual Activity    Alcohol use: Yes     Comment: infrequent social     Drug use: Yes     Frequency: 7.0 times per week     Comment: usually daily marijuana    Sexual activity: Not on file   Other Topics Concern    Not on file   Social History Narrative    Not on file     Social Determinants of Health     Financial Resource Strain:     Difficulty of Paying Living Expenses:    Food Insecurity:     Worried About Programme researcher, broadcasting/film/video in the Last Year:     Barista in the Last Year:    Transportation Needs:     Lack of Transportation (Medical):     Lack of Transportation (Non-Medical):    Physical Activity:     Days  of Exercise per Week:     Minutes of  Exercise per Session:    Stress:     Feeling of Stress :    Social Connections:     Frequency of Communication with Friends and Family:     Frequency of Social Gatherings with Friends and Family:     Attends Religious Services:     Active Member of Clubs or Organizations:     Attends Engineer, structural:     Marital Status:    Intimate Partner Violence:     Fear of Current or Ex-Partner:     Emotionally Abused:     Physically Abused:     Sexually Abused:        Family History   Problem Relation Age of Onset    Cancer Mother         Colon Cancer     Arthritis Father     Hearing loss Father     Heart disease Father     Kidney disease Father     Alcohol abuse Brother     Diabetes Brother     Early death Brother     Liver cancer Brother     Arthritis Paternal Grandmother     Diabetes Maternal Grandfather     Cancer Paternal Grandfather         Colon cancer        Allergies   Allergen Reactions    Ciprofloxacin Itching    Morphine Nausea And Vomiting         Current Outpatient Medications:     Ascorbic Acid (VITAMIN C PO), Take 1 tablet by mouth every other day  , Disp: , Rfl:     b complex vitamins tablet, Take 1 tablet by mouth every other day  , Disp: , Rfl:     Ginger, Zingiber officinalis, (GINGER EXTRACT) 250 MG Cap, Take 1 tablet by mouth nightly., Disp: , Rfl:     olmesartan (BENICAR) 40 MG tablet, Take 40 mg by mouth daily.Hold DOS, Disp: , Rfl:     pantoprazole (PROTONIX) 40 MG tablet, Take 40 mg by mouth daily, Disp: , Rfl:     pregabalin (LYRICA) 100 MG capsule, Take 100 mg by mouth 2 (two) times daily., Disp: , Rfl:     rosuvastatin (CRESTOR) 10 MG tablet, Take 10 mg by mouth nightly.  , Disp: , Rfl:     TURMERIC PO, Take by mouth daily  , Disp: , Rfl:     VITAMIN D, ERGOCALCIFEROL, PO, Take 1 tablet by mouth every other day.Not taking this med  , Disp: , Rfl:       Physical Examination:  Vitals:    10/21/19 1117   BP: 145/82   Pulse: (!) 57     General: In NAD,  well developed, well nourished.  HEENT: sclera is normal, mucous membranes are moist, neck is supple. No carotid bruits  Cardiovascular: S1 S2 heard, regular rate and rhythm, without murmurs, rubs or gallops  Lungs: Clear to auscultation b/l, normal excursion, no rhonchi, rales or wheezes b/l  Abdomen: +bs, soft, nontender, non-distended, no organomegaly   Extremities: no edema, 2+ pulses b/l in upper and lower extremities  Skin: no rashes or lesions noted  Neurological: nonfocal, moves all extremities well  Psychiatric: alert and oriented x 3, normal affect  Lymphatics: no lymphadenopathy        Impression and Recommendations:  Jesus Bowman is a 64 y.o. who presents for  consultation regarding abnormal nuclear stress test    1. Chest pain, unspecified type  ECG 12 lead (Normal)   2. Coronary artery disease involving native coronary artery of native heart without angina pectoris     3. Primary hypertension     4. Familial hypercholesterolemia         EKG today showed normal sinus rhythm was a normal tracing    Assessment and plan    1.  Abnormal nuclear stress test.  We discussed the options at the present time to include cardiac catheterization versus a CT angiogram.  Since he has no reversible ischemia on his nuclear stress test, I described to him he would be unlikely we would perform a stent to recommend bypass surgery for single-vessel coronary disease that caused only a small lateral infarct.  To assure him that he has only single-vessel coronary disease involving the left circumflex it appears much less risky to obtain a CT angiogram and if there is no significant disease in the right coronary artery and LAD continued medical therapy would be the best approach at this time.  When given the risks and benefits to cardiac catheterization versus a CT angiogram the patient has requested to proceed with a CT angiogram with final recommendations depending on the results    2.  Hypertension-presently  well controlled continue medical management    3.  Hypercholesterolemia-continue statin     patient will have a fasting lipid profile during his annual physical-any further recommendations depending on the results

## 2019-10-29 ENCOUNTER — Encounter: Payer: Self-pay | Admitting: Internal Medicine

## 2019-10-29 ENCOUNTER — Ambulatory Visit: Payer: Medicare Other | Attending: Internal Medicine

## 2019-10-29 NOTE — Pre-Procedure Instructions (Addendum)
Surgical Risk Level : (Low, Intermediate, High)  o low     Surgeon Testing Requirements:  o none     Anesthesia Guideline Requirements:  o none     Specialist Notes / Test Results / Records Requested:  o Cardiology note and stress test in epic      Recent Hospitalization / ED Visit:   o none     Future Plan / Upcoming Appts:   o CT Angio on 10/19 in am (before EGD)     Labs/Testing @ [facility] PSS:   o none     Email Sent To Patient:   o Provided PSS  phone [phone] to patient       NPO Instructions given to patient:    o NPO instructions reviewed: Clear liquids up to 2 hours prior to arrival time, then NPO. No solid food 8 hours prior to scheduled procedure time. Examples of clear liquids include water, apple juice, sports drinks such as Gatorade, coffee or tea without milk or cream. Sugar or sweetener may be added  o Fasting Requirements per Preoperative Fasting Guidelines for Elective Surgeries and Procedures Requiring Anesthesia Policy Revised 05/2019.  Ingested material Fasting requirement   Clear liquids/Ice Chips 2 hours prior to arrival time   Breast milk 4 hours prior to scheduled procedure time   Infant formula 6 hours prior to scheduled procedure time   Non-human milk 8 hours prior to scheduled procedure time   Solid food 8 hours prior to scheduled procedure time      Faxes Sent To:   o Pharmacy- DOS medication orders NONE on posting      Epic Orders Entered:   o Preop Nursing Anesthesia Orders  o IV LR      Other Outlying information gathered that does not fit anywhere else  Message to anesthesia NP:  10/19 EGD Dr. Delton See ABN Nuclear Stress Test 10/21/19 Dr. Dareen Piano cardiac eval in epic notes, will have CT Angiogram- in am at [facility], then have the EGD later that afternoon. BMI 41, METS 6-swims 30 min every few days, HTN, HLD, fibromylagia, oh, and he smokes maijuana daily Okay that his has the CT angio in the am then EGD in the PM or should he wait for CT angio result before  proceeding ?       Chart Room Handoff for Further  Follow-up if Applicable:  o None     Visitor Restriction Guidelines per [facility] as of 08/03/19:  All visitors must adhere to the following:    Exhibit no COVID-19 symptoms    Age 59+ (internally exceptions can be made by administrative team as needed, e.g., siblings, end of life situations, etc.)    In keeping with the CDCs current guidance, regardless of vaccination status, everyone in a healthcare facility must wear a mask covering their mouth and nose the entire time they are in the facility. Visitors who fail to wear a mask properly will be asked to leave.    The following face coverings cannot be worn at any [place] location: gaiter style masks, bandanas or vented masks.    No visitors are allowed for patients with suspected or confirmed COVID-19, except in end-of-life situations.  Hospital Inpatient:   Visitation hours: 9 a.m. - 6:30 p.m. daily    Adult patients may have two visitors, in addition to a [employer] Person (DSP), if applicable    Pediatric patients may have two parents/guardians at bedside 24/7. For pediatric outpatient areas,  only parents/guardians may visit  Outpatient/Ambulatory Surgery:    Adult patients may have one visitor, in addition to their Designated Support Person (DSP) on the day of surgery.    No family members will be allowed into Phase 1 recovery areas. Physicians will call contact person to give report of procedure.    Family / visitor will be called by PACU staff  to review discharge instructions via phone and answer any questions.     Advise to call surgeon if need to cancel surgery arises.      Patient verbalized understanding and acceptance of above information.

## 2019-10-30 LAB — ECG 12-LEAD
Atrial Rate: 56 {beats}/min
P Axis: 11 degrees
P-R Interval: 134 ms
Q-T Interval: 402 ms
QRS Duration: 88 ms
QTC Calculation (Bezet): 387 ms
R Axis: 3 degrees
T Axis: 46 degrees
Ventricular Rate: 56 {beats}/min

## 2019-11-03 ENCOUNTER — Ambulatory Visit: Payer: Medicare Other | Admitting: Certified Registered"

## 2019-11-03 ENCOUNTER — Encounter: Payer: Self-pay | Admitting: Internal Medicine

## 2019-11-03 ENCOUNTER — Ambulatory Visit
Admission: RE | Admit: 2019-11-03 | Discharge: 2019-11-03 | Disposition: A | Discharge status: Home / Self Care | Payer: Medicare Other | Source: Ambulatory Visit | Attending: Internal Medicine | Admitting: Internal Medicine

## 2019-11-03 ENCOUNTER — Other Ambulatory Visit: Payer: Self-pay | Admitting: Cardiovascular Disease

## 2019-11-03 ENCOUNTER — Ambulatory Visit: Payer: Self-pay

## 2019-11-03 ENCOUNTER — Encounter
Admission: RE | Disposition: A | Discharge status: Home / Self Care | Payer: Self-pay | Source: Ambulatory Visit | Attending: Internal Medicine

## 2019-11-03 DIAGNOSIS — R1013 Epigastric pain: Secondary | ICD-10-CM

## 2019-11-03 DIAGNOSIS — K219 Gastro-esophageal reflux disease without esophagitis: Secondary | ICD-10-CM | POA: Insufficient documentation

## 2019-11-03 DIAGNOSIS — K2960 Other gastritis without bleeding: Secondary | ICD-10-CM | POA: Insufficient documentation

## 2019-11-03 DIAGNOSIS — E785 Hyperlipidemia, unspecified: Secondary | ICD-10-CM | POA: Insufficient documentation

## 2019-11-03 DIAGNOSIS — I10 Essential (primary) hypertension: Secondary | ICD-10-CM | POA: Insufficient documentation

## 2019-11-03 HISTORY — PX: EGD: SHX3789

## 2019-11-03 SURGERY — DONT USE, USE 1095-ESOPHAGOGASTRODUODENOSCOPY (EGD), DIAGNOSTIC
Anesthesia: Anesthesia General | Site: Abdomen

## 2019-11-03 MED ORDER — PROPOFOL INFUSION 10 MG/ML
INTRAVENOUS | Status: DC | PRN
Start: 2019-11-03 — End: 2019-11-03
  Administered 2019-11-03: 70 mg via INTRAVENOUS

## 2019-11-03 MED ORDER — FENTANYL CITRATE (PF) 50 MCG/ML IJ SOLN (WRAP)
INTRAMUSCULAR | Status: DC | PRN
Start: 2019-11-03 — End: 2019-11-03
  Administered 2019-11-03: 100 ug via INTRAVENOUS

## 2019-11-03 MED ORDER — FENTANYL CITRATE (PF) 50 MCG/ML IJ SOLN (WRAP)
INTRAMUSCULAR | Status: AC
Start: 2019-11-03 — End: ?
  Filled 2019-11-03: qty 2

## 2019-11-03 MED ORDER — GLYCOPYRROLATE 0.2 MG/ML IJ SOLN (WRAP)
INTRAMUSCULAR | Status: DC | PRN
Start: 2019-11-03 — End: 2019-11-03
  Administered 2019-11-03 (×2): .2 mg via INTRAVENOUS

## 2019-11-03 MED ORDER — LACTATED RINGERS IV SOLN
INTRAVENOUS | Status: DC
Start: 2019-11-03 — End: 2019-11-03

## 2019-11-03 MED ORDER — LIDOCAINE HCL 2 % IJ SOLN
INTRAMUSCULAR | Status: DC | PRN
Start: 2019-11-03 — End: 2019-11-03
  Administered 2019-11-03: 80 mg via INTRAVENOUS

## 2019-11-03 SURGICAL SUPPLY — 50 items
BLOCK BITE MAXI 60FR LF STRD STRAP SDPRT (Procedure Accessories) ×1
BLOCK BITE OD60 FR STURDY STRAP SIDEPORT (Procedure Accessories) ×1 IMPLANT
BLOCK BITE OD60 FR STURDY STRAP SIDEPORT DENTAL RETENTION RIM MAXI (Procedure Accessories) ×1 IMPLANT
CATHETER BALLOON DILATATION CRE 2.8 MM (Balloons) IMPLANT
CATHETER BALLOON DILATATION CRE PEBAX (Balloons) IMPLANT
CATHETER ELHMST HMGLD GLDPRB 7FR 300CM (Procedure Accessories)
CATHETER OD10-11-12 MM ODSEC6 FR L180 CM CRE BALLOON DILATATION L8 CM (Balloons) IMPLANT
CATHETER OD10-11-12 MM ODSEC6 FR L180 CM CREâ„¢ BALLOON DILATATION L8 CM (Balloons) IMPLANT
CATHETER OD15-16.5-18 MM ODSEC6 FR L180 CM CRE BALLOON DILATATION L8 (Balloons) IMPLANT
CATHETER OD15-16.5-18 MM ODSEC6 FR L180 CM CREâ„¢ BALLOON DILATATION L8 (Balloons) IMPLANT
CATHETER OD6 FR ODSEC12-13.5-15 MM L180 CM CRE BALLOON DILATATION L8 (Balloons) IMPLANT
CATHETER OD6 FR ODSEC12-13.5-15 MM L180 CM CREâ„¢ BALLOON DILATATION L8 (Balloons) IMPLANT
CATHETER OD6-7-8 MM ODSEC6 FR L180 CM CRE BALLOON DILATATION L8 CM (Balloons) IMPLANT
CATHETER OD6-7-8 MM ODSEC6 FR L180 CM CREâ„¢ BALLOON DILATATION L8 CM (Balloons) IMPLANT
CATHETER OD7 FR L300 CM BIPOLAR ROUND (Procedure Accessories) IMPLANT
CATHETER OD7 FR L300 CM BIPOLAR ROUND DISTAL TIP STANDARD CONNECTOR (Procedure Accessories) IMPLANT
DILATOR ESCP PEBAX 2.8MM CRE 10-11-12MM (Balloons)
DILATOR ESCP PEBAX 2.8MM CRE 15-16.5-18 (Balloons)
DILATOR ESCP PEBAX 2.8MM CRE 6-7-8MM 6FR (Balloons)
DILATOR ESCP PEBAX CRE 6FR 12-13.5-15MM (Balloons)
ELECTRODE ADULT PATIENT RETURN L9 FT REM POLYHESIVE ACRYLIC FOAM (Procedure Accessories) IMPLANT
ELECTRODE PATIENT RETURN L9 FT VALLEYLAB (Procedure Accessories) IMPLANT
ELECTRODE PT RTN RM PHSV ACRL FM C30- LB (Procedure Accessories)
FORCEPS BIOPSY L240 CM LARGE CAPACITY (Instrument) ×1 IMPLANT
FORCEPS BIOPSY L240 CM MICROMESH TEETH STREAMLINE CATHETER NEEDLE (Instrument) ×1 IMPLANT
FORCEPS BX SS LG CPC RJ 4 2.4MM 240CM (Instrument) ×1
GLOVE EXAM LARGE NITRILE CHEMOTHERAPY POWDER FREE SENSE OATMEAL (Glove) ×2 IMPLANT
GLOVE EXAM LARGE NITRILE POWDER FREE SENSE OATMEAL (Glove) ×2 IMPLANT
GLOVE EXAM NITRILE RESTORE LG (Glove) ×2 IMPLANT
GLV EXAM NITRILE RESTORE LG (Glove) ×4
GOWN ISL PP PE REG LG LF FULL BCK NK TIE (Gown) ×4 IMPLANT
GOWN ISOLATION REGULAR LARGE FULL BACK NECK TIE ELASTIC CUFF (Gown) ×2 IMPLANT
NEEDLE SCLEROTHERAPY CARR-LOCKE OD25 GA ODSEC2.5 MM L230 CM INJECTION (Needles) IMPLANT
NEEDLE SCLEROTHERAPY OD25 GA ODSEC2.5 MM (Needles) IMPLANT
NEEDLE SCLRTX SS TFLN CRLK 25GA 2.5MM (Needles)
PROBE ELECTROSURGICAL L220 CM FLEXIBLE (Procedure Accessories) IMPLANT
PROBE ELECTROSURGICAL L220 CM FLEXIBLE STRAIGHT FIRE OD2.3 MM FIAPC (Procedure Accessories) IMPLANT
PROBE ESURG FIAPC 2.3MM 220CM STRL FLXB (Procedure Accessories)
SNARE ESCP MIC CPTVTR 13MM 240IN STRL (GE Lab Supplies) IMPLANT
SNARE SMALL HEXAGON CAPTIVATOR STIFF ENDOSCOPIC POLYPECTOMY (GE Lab Supplies) IMPLANT
SPONGE GAUZE L4 IN X W4 IN 16 PLY (Dressing) ×1 IMPLANT
SPONGE GAUZE L4 IN X W4 IN 16 PLY MAXIMUM ABSORBENT USP TYPE VII (Dressing) ×1 IMPLANT
SPONGE GZE CTTN CRTY 4X4IN LF NS 16 PLY (Dressing) ×1
SYRINGE 50 ML GRADUATE NONPYROGENIC DEHP (Syringes, Needles) IMPLANT
SYRINGE 50 ML GRADUATE NONPYROGENIC DEHP FREE PVC FREE BD MEDICAL (Syringes, Needles) IMPLANT
SYRINGE INFL 60ML ALN II STRL GA DISP (Syringes, Needles)
SYRINGE INFLATION 60 ML GAUGE CRE (Syringes, Needles) IMPLANT
SYRINGE MED 50ML LF STRL GRAD N-PYRG (Syringes, Needles)
WATER STERILE PLASTIC POUR BOTTLE 250 ML (Irrigation Solutions) ×1 IMPLANT
WATER STRL 250ML LF PLS PR BTL (Irrigation Solutions)

## 2019-11-03 NOTE — Anesthesia Preprocedure Evaluation (Addendum)
Anesthesia Evaluation    AIRWAY    Mallampati: II    TM distance: >3 FB  Neck ROM: full  Mouth Opening:full  Planned to use difficult airway equipment: No CARDIOVASCULAR    cardiovascular exam normal, regular and normal       DENTAL    no notable dental hx     PULMONARY    pulmonary exam normal and clear to auscultation     OTHER FINDINGS                  Relevant Problems   CARDIO   (+) Coronary artery disease involving native coronary artery of native heart without angina pectoris   (+) Primary hypertension       PSS Anesthesia Comments: Morbid obesity, BMI 40,72, Snoring, , HTn, GERD, Excercise induced angina, Fibromyalgia, STOP BANG=6, ECG , NSR,  Myocard perfusion test- EF 50%, , STOP BANG=6,         Anesthesia Plan    ASA 3     general                     intravenous induction   Detailed anesthesia plan: general IV  Monitors/Adjuncts: other    Post Op: other  Trial extubation is not planned.  Post op pain management: per surgeon    informed consent obtained                   Signed by: Juanell Fairly, MD 11/03/19 3:33 PM

## 2019-11-03 NOTE — Transfer of Care (Signed)
Anesthesia Transfer of Care Note    Patient: Jesus Bowman    Procedures performed: Procedure(s) with comments:  EGD - EGD  Asst=N    Anesthesia type: General TIVA    Patient location:[name] PACU    Last vitals:   Vitals:    11/03/19 1720   BP: 141/75   Pulse: 62   Resp: 16   Temp:    SpO2: 96%       Post pain: Patient not complaining of pain, continue current therapy      Mental Status:awake    Respiratory Function: tolerating room air    Cardiovascular    Nausea/Vomiting: patient not complaining of nausea or vomiting    Hydration Status: adequate    Post assessment: no apparent anesthetic complications    Signed by: Juanell Fairly, MD  11/03/19 5:35 PM

## 2019-11-03 NOTE — Discharge Instr - AVS First Page (Signed)
Reason for your Hospital Admission:  EGD    Instructions for after your discharge:  EGD Discharge Instructions  General Instructions:  1. Following sedation, your judgement, perception, and coordination are considered impaired. Even though you may feel awake and alert, you are considered legally intoxicated. Therefore, until the next morning;  · Do not Drive  · Do not operate appliances or equipment that requires reaction time (e.g. Stove, electrical tools, machinery)  · Do not sign legal documents or be involved in important decisions.  · Do not smoke if alone  · Do not drink alcoholic beverages  · Go directly home and rest for several hours before resuming your routine activities.  · It is highly recommended to have a responsible adult stay with you for the  next 24 hours    2. Tenderness, swelling or pain may occur at the IV site where you received sedation. If you experience this, apply warm soaks to the area. Notify your physician if this persists.    Instructions Specific To Procedures - Report To Physician Any Of The Following:    Upper Endoscopy, ERCP, Dilations   1. Pain in Chest   2. Nausea/vomitting   3. Fevers/Chills within 24 hours after procedure. Temp>101deg F   4. Severe and persistent abdominal pain and bloating     In Addition:   Mild throat soreness may follow this procedure. Warm salt water gargling or lozenges of your choice will most likely relieve your discomfort or cold drinks and  popsicles.       Additional Discharge Instructions  Your diet after the procedure: Start with something light (Toast, Jello, Soup, Etc.), Then Resume to Regular Diet as Tolerated. Nothing Spicy, Greasy or Fried Foods for the First Meal. NO RED FLUIDS, FOODS OR SAUCES FOR 24 HOURS.    Special Instructions: None  Prescriptions given: None  Patient education literature given;yes    If you have questions or problems contact your MD immediately. If you need immediate attention, call your MD, 911 and/or go to nearest  emergency room.

## 2019-11-03 NOTE — Anesthesia Postprocedure Evaluation (Signed)
Anesthesia Post Evaluation    Patient: Jesus Bowman    Procedure(s) with comments:  EGD - EGD  Asst=N    Anesthesia type: general    Last Vitals:   Vitals Value Taken Time   BP 141/75 11/03/19 1720   Temp 36.4 C (97.5 F) 11/03/19 1702   Pulse 62 11/03/19 1720   Resp 16 11/03/19 1720   SpO2 96 % 11/03/19 1720                 Anesthesia Post Evaluation:     Patient Evaluated: PACU  Patient Participation: complete - patient participated  Level of Consciousness: awake and alert  Pain Score: 0  Pain Management: adequate    Airway Patency: patent    Anesthetic complications: No      PONV Status: none    Cardiovascular status: stable  Respiratory status: room air  Hydration status: stable        Signed by: Juanell Fairly, MD, 11/03/2019 5:32 PM

## 2019-11-03 NOTE — H&P (Signed)
GI PRE PROCEDURE NOTE    Proceduralist Comments:   Review of Systems and Past Medical / Surgical History performed: Yes     Indications:GERD     Previous Adverse Reaction to Anesthesia or Sedation (if yes, describe): No    Physical Exam / Laboratory Data (If applicable)   General: Alert and cooperative  Lungs: Lungs clear to auscultation  Cardiac: RRR, normal S1S2.    Abdomen: Soft, non tender. Normal active bowel sounds  Other:     Recent CBC No results for input(s): HGB, HCT, PLT in the last 24 hours.    Invalid input(s): WBCIR  Recent BMP No results for input(s): GLU, BUN, CREAT, CA, NA, K, CL, CO2 in the last 24 hours.    Invalid input(s): AGAP  Recent CMP No results for input(s): GLU, BUN, CREAT, NA, CK, CO2, CA, ALB, AST, ALT, ALP, GLOB in the last 24 hours.    Invalid input(s):  CL, TP, BILIT, AG,  AGAP  Recent PT/PTT No results for input(s): INR, PTT in the last 24 hours.    Invalid input(s): PTI, COUM, COUMP, ACOAG, ACOAP  Recent PT/INR No results for input(s): INR in the last 24 hours.    Invalid input(s): PTI, COUM, COUMP    American Society of Anesthesiologists (ASA) Physical Status Classification:   Anesthesia ASA Score: 3          Planned Sedation:   Deep sedation with anesthesia    Attestation:   [name] has been reassessed immediately prior to the procedure and is an appropriate candidate for the planned sedation and procedure. Risks, benefits and alternatives to the planned procedure and sedation have been explained to the patient or guardian:  yes        Signed by: Kathreen Cornfield, MD

## 2019-11-04 ENCOUNTER — Encounter (INDEPENDENT_AMBULATORY_CARE_PROVIDER_SITE_OTHER): Payer: Self-pay | Admitting: Cardiovascular Disease

## 2019-11-04 ENCOUNTER — Encounter: Payer: Self-pay | Admitting: Internal Medicine

## 2020-04-22 ENCOUNTER — Ambulatory Visit (INDEPENDENT_AMBULATORY_CARE_PROVIDER_SITE_OTHER): Payer: Medicare Other | Admitting: Cardiovascular Disease

## 2021-06-20 ENCOUNTER — Other Ambulatory Visit (INDEPENDENT_AMBULATORY_CARE_PROVIDER_SITE_OTHER): Payer: Self-pay | Admitting: Orthopaedic Surgery

## 2021-06-27 ENCOUNTER — Emergency Department: Payer: Medicare Other

## 2021-06-27 ENCOUNTER — Emergency Department
Admission: EM | Admit: 2021-06-27 | Discharge: 2021-06-27 | Disposition: A | Discharge status: Home / Self Care | Payer: Medicare Other | Attending: Emergency Medicine | Admitting: Emergency Medicine

## 2021-06-27 DIAGNOSIS — R079 Chest pain, unspecified: Secondary | ICD-10-CM | POA: Insufficient documentation

## 2021-06-27 LAB — COMPREHENSIVE METABOLIC PANEL
ALT: 18 U/L (ref 0–55)
AST (SGOT): 23 U/L (ref 5–41)
Albumin/Globulin Ratio: 1.5 (ref 0.9–2.2)
Albumin: 3.8 g/dL (ref 3.5–5.0)
Alkaline Phosphatase: 87 U/L (ref 37–117)
Anion Gap: 8 (ref 5.0–15.0)
BUN: 13 mg/dL (ref 9.0–28.0)
Bilirubin, Total: 0.4 mg/dL (ref 0.2–1.2)
CO2: 24 mEq/L (ref 17–29)
Calcium: 9.2 mg/dL (ref 8.5–10.5)
Chloride: 104 mEq/L (ref 99–111)
Creatinine: 1.2 mg/dL (ref 0.5–1.5)
Globulin: 2.6 g/dL (ref 2.0–3.6)
Glucose: 85 mg/dL (ref 70–100)
Potassium: 4.6 mEq/L (ref 3.5–5.3)
Protein, Total: 6.4 g/dL (ref 6.0–8.3)
Sodium: 136 mEq/L (ref 135–145)
eGFR: >60 mL/min/{1.73_m2} (ref >=60)

## 2021-06-27 LAB — CBC AND DIFFERENTIAL
Absolute NRBC: 0 10*3/uL (ref 0.00–0.00)
Basophils Absolute Automated: 0.03 10*3/uL (ref 0.00–0.08)
Basophils Automated: 0.5 %
Eosinophils Absolute Automated: 0.13 10*3/uL (ref 0.00–0.44)
Eosinophils Automated: 2.2 %
Hematocrit: 43.8 % (ref 37.6–49.6)
Hgb: 14.2 g/dL (ref 12.5–17.1)
Immature Granulocytes Absolute: 0.02 10*3/uL (ref 0.00–0.07)
Immature Granulocytes: 0.3 %
Instrument Absolute Neutrophil Count: 2.58 10*3/uL (ref 1.10–6.33)
Lymphocytes Absolute Automated: 2.52 10*3/uL (ref 0.42–3.22)
Lymphocytes Automated: 43.3 %
MCH: 28.6 pg (ref 25.1–33.5)
MCHC: 32.4 g/dL (ref 31.5–35.8)
MCV: 88.1 fL (ref 78.0–96.0)
MPV: 9.5 fL (ref 8.9–12.5)
Monocytes Absolute Automated: 0.54 10*3/uL (ref 0.21–0.85)
Monocytes: 9.3 %
Neutrophils Absolute: 2.58 10*3/uL (ref 1.10–6.33)
Neutrophils: 44.4 %
Nucleated RBC: 0 /100 WBC (ref 0.0–0.0)
Platelets: 255 10*3/uL (ref 142–346)
RBC: 4.97 10*6/uL (ref 4.20–5.90)
RDW: 14 % (ref 11–15)
WBC: 5.82 10*3/uL (ref 3.10–9.50)

## 2021-06-27 LAB — ECG 12-LEAD
P-R Interval: 162 ms
QRS Duration: 102 ms

## 2021-06-27 LAB — HIGH SENSITIVITY TROPONIN-I: hs Troponin-I: 3 ng/L

## 2021-06-27 LAB — PROBNP: NT-proBNP: 32 pg/mL (ref 0–125)

## 2021-06-27 NOTE — ED Notes (Signed)
I have assisted in the ordering of diagnostic studies necessary to expedite care. I am not the primary provider for this patient. Please refer to the provider/reassessment/disposition notes for further information about this patient's emergency department course.     Eldridge Dace, MD  06/27/21 [phone]

## 2021-06-27 NOTE — ED Provider Notes (Signed)
[facility] EMERGENCY DEPARTMENT  ATTENDING PHYSICIAN HISTORY AND PHYSICAL EXAM     Patient Name: Jesus Bowman, Jesus Bowman  Department:[facility] EMERGENCY DEPT  Encounter Date:  06/27/2021  Attending Physician: Reeves Forth., MD   Age: 66 y.o. male  Patient Room: S 21/S 21  PCP: Renette Butters, MD           Diagnosis/Disposition:     Final diagnoses:   Chest pain, unspecified type       ED Disposition       ED Disposition   Discharge    Condition   --    Date/Time   Tue Jun 27, 2021  7:26 PM    Comment   Jesus Bowman discharge to home/self care.    Condition at disposition: Stable                 Follow-Up Providers (if applicable)    Al-Dalli, Mohammed, MD  [address]  [place] [address]  [phone]    In 3 days      [facility]  [address]  [place] [address]  [phone]           New Prescriptions    No medications on file             Medical Decision Making:   {THIS REVIEW SECTION WILL AUTODELETE ONCE NOTE IS SIGNED  Action Links   Snapshot  Orders  Decision Support  Secure Chat  Transfer Documents  Patient Care Timeline  Workup  Dispo  END REVIEW SECTION(Optional):55325}  Initial Differential Diagnosis:  Initial differential diagnosis to include but not limited to: {CWC:37628}    Plan:  ***    Final Impression:  ***  The patient was deemed stable for discharge. They were given strict return precautions as it relates to their presumed diagnosis, verbalized understanding of these precautions and agreed to follow up as instructed. All questions were answered prior to discharge.         Medical Decision Making  Problems Addressed:  Chest pain, unspecified type: acute illness or injury    Amount and/or Complexity of Data Reviewed  External Data Reviewed: notes.  Labs: ordered.  Radiology: ordered.  ECG/medicine tests: ordered and independent interpretation performed.      {Records Reviewed (internal and external)? (Optional):61520::"N/A"}  {Was management  discussed with a consultant? (Optional):61519::"N/A"}  {Diagnostic test considered and not performed (Optional):61521::"N/A"}  {Prescription medications considered and not given (Optional):61522::"N/A"}  {Hospitalization considered but not done (Optional):61523}  {Was the decision around the need for surgery discussed with a consultant? (Optional):61527::"N/A"}  {Social Determinants of Health Considerations (Optional):61524::"N/A"}  {Was there decision to not resuscitate or to de-escalate care due to poor prognosis? (Optional):61525::"N/A"}          History of Presenting Illness:   {THIS REVIEW SECTION WILL AUTODELETE ONCE NOTE IS SIGNED  History Review Links   Snapshot  Problem List  Care Everywhere  PMH  PSH  FamHx  SocHx  Allergies  Meds  Immunizations  END REVIEW SECTION(Optional):55325}  Nursing Triage note: pt c/o chest pain since last night. pt went to UC today, and they told pt that he has fluid on his heart. pt endorse a "little bit" of SOB. chest pain does not radiate. pt followed by cardiology. axo x4. - thinners  Chief complaint: Chest Pain    Jesus Bowman is a 67 y.o. male with pmhx of CAD, HTN, HLD, congenital spinal stenosis,  and fibromyalgia who presents with nonexertional chest pain since last night. States pain improves with deep breaths. No other aggravating or alleviating factors. Has seen a cardiologist in the past.  No hx of cancer or blood clots.    +former smoker    History obtained from: patient, review of prior chart       Review of Systems:  Physical Exam:     Review of Systems    Positive and negative ROS per above and in HPI. All other systems reviewed and negative.     Pulse (!) 51  BP 117/71  Resp 20  SpO2 97 %  Temp 98.9 F (37.2 C)     Constitutional: Overweight male, laying on a gurney, in no acute distress   Head: Normocephalic, atraumatic  Eyes: EOMI.  Sclera not pale, injected.  ENT: Mucous membranes moist.  Nose symmetric.  Neck: Normal range of  motion. Non-tender.  Respiratory/Chest: Clear to auscultation. No respiratory distress.   Cardiovascular: RRR w/o murmur/gallop  Abdomen: Soft and non-tender. No guarding. No masses or hepatosplenomegaly.  Genitourinary:   Musculoskeletal: No edema. No cyanosis.  Neurological: CN Bowman-XII intact grossly. No focal motor deficits by observation. Speech normal.  Skin: Warm and dry. No rash.  Psychiatric: Normal affect. Normal concentration.         Interpretations, Clinical Decision Tools and Critical Care:   {THIS REVIEW SECTION WILL AUTODELETE ONCE NOTE IS SIGNED  Interpretation/Data Review Links   Snapshot  Patient Care Timeline  Workup Results Review  EKG  Labs  Imaging  Media  Encounter Orders   END REVIEW SECTION(Optional):55325}  EKG: I reviewed and Independently interpreted the patient's EKG as sinus bradycardia at 48.  Non-specific ST changes           Procedures:   Procedures      Attestations:     Scribe Attestation: I am the first provider for this patient and I personally performed the services documented. Margo Common is scribing for me on this chart. This note and the patient instructions accurately reflect work and decisions made by me.      Documentation Notes:  Parts of this note were generated by the Epic EMR system/ Dragon speech recognition and may contain inherent errors or omissions not intended by the user. Grammatical errors, random word insertions, deletions, pronoun errors and incomplete sentences are occasional consequences of this technology due to software limitations. Not all errors are caught or corrected.  My documentation is often completed after the patient is no longer under my clinical care. In some cases, the Epic EMR may pull updated results into the above documentation which may not reflect all results or information that were available to me at the time of my medical decision making.   If there are questions or concerns about the content of this note or information  contained within the body of this dictation they should be addressed directly with the author for clarification.

## 2021-06-27 NOTE — ED Triage Notes (Addendum)
Pt coming in for Chest pain that started last night, intermittent pressure that is non-radiating  Pt went to UC and was sent here for "fluid around his heart"  Pt denies any SOB, N/V, denies any swelling in his legs, denies dizziness or lightheadedness  Pt alert and oriented x4, ambulatory, hx of htn, hld

## 2021-06-27 NOTE — Discharge Instructions (Signed)
Dear Jesus Bowman:    Thank you for choosing the [facility] Emergency Department, the premier emergency department in the [place] area.  I hope your visit today was EXCELLENT. You will receive a survey via text message that will give you the opportunity to provide feedback to your team about your visit. Please do not hesitate to reach out with any questions!    Specific instructions for your visit today:        IF YOU DO NOT CONTINUE TO IMPROVE OR YOUR CONDITION WORSENS, PLEASE CONTACT YOUR DOCTOR OR RETURN IMMEDIATELY TO THE EMERGENCY DEPARTMENT.    Sincerely,  Lajoyce Tamura, Louanne Belton., MD  Attending Emergency Physician  [facility] Emergency Department      OBTAINING A PRIMARY CARE APPOINTMENT    Primary care physicians (PCPs, also known as primary care doctors) are either internists or family medicine doctors. Both types of PCPs focus on health promotion, disease prevention, patient education and counseling, and treatment of acute and chronic medical conditions.    If you need a primary care doctor, please call the below number and ask who is receiving new patients.     [facility]  Telephone:  [phone]  [email or web address]    DOCTOR REFERRALS  Call [phone] (available 24 hours a day, 7 days a week) if you need any further referrals and we can help you find a primary care doctor or specialist.  Also, available online at:  [email or web address]    YOUR CONTACT INFORMATION  Before leaving please check with registration to make sure we have an up-to-date contact number.  You can call registration at [phone] to update your information.  For questions about your hospital bill, please call [phone].  For questions about your Emergency Dept Physician bill please call [phone].      FREE HEALTH SERVICES  If you need help with health or social services, please call 2-1-1 for a free referral to resources in your area.  2-1-1 is a free service connecting  people with information on health insurance, free clinics, pregnancy, mental health, dental care, food assistance, housing, and substance abuse counseling.  Also, available online at:  http://www.[facility].org    ORTHOPEDIC INJURY   Please know that significant injuries can exist even when an initial x-ray is read as normal or negative.  This can occur because some fractures (broken bones) are not initially visible on x-rays.  For this reason, close outpatient follow-up with your primary care doctor or bone specialist (orthopedist) is required.    MEDICATIONS AND FOLLOWUP  Please be aware that some prescription medications can cause drowsiness.  Use caution when driving or operating machinery.    The examination and treatment you have received in our Emergency Department is provided on an emergency basis, and is not intended to be a substitute for your primary care physician.  It is important that your doctor checks you again and that you report any new or remaining problems at that time.      ASSISTANCE WITH INSURANCE    Affordable Care Act  [facility])  Call to start or finish an application, compare plans, enroll or ask a question.  [phone]  TTY: [phone]  Web:  Healthcare.gov    Help Enrolling in [facility]  [employer] [place]  (209)370-0396 (TOLL-FREE)  ([phone] (TTY)  Web:  Http://www.coverva.org    Local Help Enrolling in the [facility]  [place] [facility]  [phone] (MAIN)  Email:  health-help@nvfs .org  Web:  BlackjackMyths.is  Address:  [address] [place], [place] [address]    SEDATING MEDICATIONS  Sedating medications include strong pain medications (e.g. narcotics), muscle relaxers, benzodiazepines (used for anxiety and as muscle relaxers), Benadryl/diphenhydramine and other antihistamines for allergic reactions/itching, and other medications.  If you are unsure if you have received a sedating medication, please ask your physician or nurse.  If you received a  sedating medication: DO NOT drive a car. DO NOT operate machinery. DO NOT perform jobs where you need to be alert.  DO NOT drink alcoholic beverages while taking this medicine.     If you get dizzy, sit or lie down at the first signs. Be careful going up and down stairs.  Be extra careful to prevent falls.     Never give this medicine to others.     Keep this medicine out of reach of children.     Do not take or save old medicines. Throw them away when outdated.     Keep all medicines in a cool, dry place. DO NOT keep them in your bathroom medicine cabinet or in a cabinet above the stove.    MEDICATION REFILLS  Please be aware that we cannot refill any prescriptions through the ER. If you need further treatment from what is provided at your ER visit, please follow up with your primary care doctor or your pain management specialist.    [place]  Did you know [facility] has two freestanding ERs located just a few miles away?  [facility] [place] have short wait times, easy free parking directly in front of the building and top patient satisfaction scores - and the same Board Certified Emergency Medicine doctors as [facility].

## 2021-06-28 LAB — ECG 12-LEAD
Atrial Rate: 48 {beats}/min
P Axis: 22 degrees
Q-T Interval: 444 ms
QTC Calculation (Bezet): 396 ms
R Axis: -21 degrees
T Axis: 4 degrees
Ventricular Rate: 48 {beats}/min

## 2021-06-30 ENCOUNTER — Telehealth (INDEPENDENT_AMBULATORY_CARE_PROVIDER_SITE_OTHER): Payer: Self-pay | Admitting: Cardiovascular Disease

## 2021-06-30 NOTE — Telephone Encounter (Addendum)
Spoke with Jesus Bowman. States PCP ordered a cardiac scan but is unsure what it is.    Relays chest pain onset first on 6/13. Describes it as continuous mid-sternal chest pressure, increased, then improves with deep breathing. Relays increases with bending forward. Currently rates chest pressure as a 2.    Denies lightheadedness/dizziness, dyspnea, palpitations, fatigue, lower extremity swelling.    Instructed pt to go to ED with worsening of symptoms. Verbalized understanding.    Spoke with Byrd Hesselbach at Dr. Al-Dalli's office who verifies PCP ordered CT Angiogram Coronary.

## 2021-06-30 NOTE — Telephone Encounter (Signed)
Confirmed with pt 6/20 NP OV.

## 2021-07-03 NOTE — Progress Notes (Signed)
 [facility] OFFICE VISIT    I had the pleasure of seeing Jesus Bowman today for cardiovascular follow up. He is a pleasant 66 y.o. male patient of Dr. Dareen Piano with a history of CAD, HTN, HLD and family history of HLD who presents for follow up on chest discomfort.     Patient evaluated in 2021 with abnormal stress test and CCTA with CAC of 27 and minimal CAD which was medically treated.     Patient was recently evaluated in the emergency room 06/27/2021 with mild shortness of breath chest pain that did not radiate. Says when he took a deep breath he would feel the pain but this also relieved his discomfort. Neg trop x 1. Chest pain came back in the AM the next day. Did not wake him up out of sleep, not on exertion. He describes as sharp pain with tightness. Made worse with deep breathing or leaning forward. No other aggravating or alleviating factors. No chest pain with climbing stairs.     He goes swimming twice  a week and has not noticed any symptoms while swimming.     Other: Denies current CP, shortness of breath, heart palpitations, dizziness, lightheadedness, syncope, leg swelling and claudicaiton.    MEDICATIONS:   Current Outpatient Medications:     Ascorbic Acid (VITAMIN C PO), Take 1 tablet by mouth daily, Disp: , Rfl:     b complex vitamins tablet, Take 1 tablet by mouth daily, Disp: , Rfl:     GARLIC PO, Take by mouth every evening, Disp: , Rfl:     olmesartan (BENICAR) 40 MG tablet, Take 1 tablet (40 mg) by mouth daily, Disp: , Rfl:     pantoprazole (PROTONIX) 40 MG tablet, Take 1 tablet (40 mg) by mouth 2 (two) times daily, Disp: , Rfl:     pregabalin (LYRICA) 100 MG capsule, Take 1 capsule (100 mg) by mouth 2 (two) times daily, Disp: , Rfl:     rosuvastatin (CRESTOR) 10 MG tablet, Take 1 tablet (10 mg) by mouth nightly, Disp: , Rfl:     TURMERIC PO, Take by mouth daily  , Disp: , Rfl:     VITAMIN D, ERGOCALCIFEROL, PO, Take 1 tablet by mouth daily, Disp: , Rfl:     Ginger, Zingiber  officinalis, (GINGER EXTRACT) 250 MG Cap, Take 1 tablet by mouth nightly. (Patient not taking: Reported on 07/04/2021), Disp: , Rfl:     sucralfate (CARAFATE) 1 g tablet, Take 1 g by mouth 2 (two) times daily (Patient not taking: Reported on 07/04/2021), Disp: , Rfl:      PHYSICAL EXAMINATION  Health Related Quality of Life:     Vital Signs: BP 100/60 (BP Site: Left arm, Patient Position: Sitting, Cuff Size: Large)   Pulse (!) 54   Ht 1.689 m (5' 6.5")   Wt 119.1 kg (262 lb 9.6 oz)   SpO2 96%   BMI 41.75 kg/m    Chest: Clear to auscultation bilaterally  Cardiovascular: RRR No murmurs or gallops.   Abdomen: Soft, nontender. No pulsatile masses or bruits.    Extremities: Warm without edema. Peripheral pulses are full and equal.    ECG:    Narrative:      SINUS BRADYCARDIA  OTHERWISE NORMAL ECG  WHEN COMPARED WITH ECG OF 27-Jun-2021 16:11,  NO SIGNIFICANT CHANGE WAS FOUND       LABS:   Lab Results   Component Value Date    WBC 5.82 06/27/2021    HGB 14.2  06/27/2021    HCT 43.8 06/27/2021    PLT 255 06/27/2021    NA 136 06/27/2021    K 4.6 06/27/2021    BUN 13.0 06/27/2021    CREAT 1.2 06/27/2021    GLU 85 06/27/2021    AST 23 06/27/2021    ALT 18 06/27/2021      2021 CCTA CALCIUM SCORING:         Left main:                 19  Left anterior descending:  8  Left circumflex:           0  Right Coronary:            0  Posterior Descending:      0  TOTAL CAC SCORE:           27         AGE/SEX MATCHED SCORE PERCENTILE:  34 % of asymptomatic patients matched  for same sex and age have a lower calcium score.         CHEST CTA FINDINGS:  The heart is normal in size. Small patent foramen ovale. No endobronchial  lesion. Right lower lobe calcified granuloma. The visualized portions of  the aorta are unremarkable. There is no pericardial effusion. Small hiatal  hernia. Degenerative changes of the spine.         CT CORONARY ANGIOGRAPHY:   Evaluation of the coronary arteries reveals the following:         Left coronary artery  dominant system.         LEFT MAIN: Mixed calcified and noncalcified plaque within the distal left  main coronary artery causes less than 25% narrowing. Trifurcation to the  LAD, LCx, and ramus intermedius branch.         LEFT ANTERIOR DESCENDING CORONARY ARTERY: Calcified plaque within the mid  LAD causes minimal narrowing (less than 25%). The LAD is otherwise patent  without significant stenosis. The diagonal branches are patent.Marland Kitchen         LEFT CIRCUMFLEX CORONARY ARTERY:  The circumflex coronary artery and  marginal branches are patent without significant stenosis.         RAMUS INTERMEDIUS BRANCH: Patent without significant stenosis.         RIGHT CORONARY ARTERY:  The RCA is a small caliber vessel. It is patent  without significant stenosis.         POSTERIOR DESCENDING CORONARY ARTERY:  The PDA arises from the left  coronary artery. The vessel is patent without stenosis.         IMPRESSION:      1.  Coronary disease. Mixed calcified and noncalcified plaque within the  distal left main coronary artery causes less than 25 % narrowing. Calcified  plaque within the mid LAD causes minimal narrowing.  2. No significant stenosis   3. Small patent foramen ovale.    NM Stress test 2021 Summary    1. Abnormal stress and rest myocardial perfusion study with evidence of a  small infarct in the territory of the LCX. The perfusion defects still  visualized despite CT attenuation correction..    2. Gated wall motion study reveals very mild hypokinesis in the apex.    3. Gated wall motion study demonstrates low normal left ventricular function  with calculated ejection fraction 50 %.    4. Review of low dose CT demonstrate a Visually Estimated minimal Coronary  Artery Calcium Score (VECAC).  IMPRESSION/RECOMMENDATIONS: Jesus Bowman is a 66 y.o. male who presents for follow up.     1. CAD: Chest pain sounds non cardiac (not on exertion) however patient has known minimal CAD. His PCP ordered CCTA to be repeated with FRC.       2.  Hypertension: presently well controlled continue medical management     3.  Hypercholesterolemia: continue statin, he follows lipid panel regularly with his family doctor.     Follow up after CCTA to discuss results

## 2021-07-04 ENCOUNTER — Encounter (INDEPENDENT_AMBULATORY_CARE_PROVIDER_SITE_OTHER): Payer: Self-pay | Admitting: Family Nurse Practitioner

## 2021-07-04 ENCOUNTER — Ambulatory Visit (INDEPENDENT_AMBULATORY_CARE_PROVIDER_SITE_OTHER): Payer: Medicare Other | Admitting: Family Nurse Practitioner

## 2021-07-04 VITALS — BP 100/60 | HR 54 | Ht 66.5 in | Wt 262.6 lb

## 2021-07-04 DIAGNOSIS — I2510 Atherosclerotic heart disease of native coronary artery without angina pectoris: Secondary | ICD-10-CM

## 2021-07-04 LAB — ECG 12-LEAD
Atrial Rate: 52 {beats}/min
P Axis: -21 degrees
P-R Interval: 134 ms
Q-T Interval: 414 ms
QRS Duration: 92 ms
QTC Calculation (Bezet): 385 ms
R Axis: -15 degrees
T Axis: 15 degrees
Ventricular Rate: 52 {beats}/min

## 2021-07-04 NOTE — Patient Instructions (Addendum)
Get Coronary CTA as ordered by primary care physician     Follow up with Dr. Dareen Piano after Coronary CT

## 2021-07-17 ENCOUNTER — Other Ambulatory Visit (INDEPENDENT_AMBULATORY_CARE_PROVIDER_SITE_OTHER): Payer: Self-pay | Admitting: Internal Medicine

## 2021-07-24 ENCOUNTER — Ambulatory Visit (INDEPENDENT_AMBULATORY_CARE_PROVIDER_SITE_OTHER): Payer: Medicare Other | Admitting: Cardiovascular Disease

## 2021-07-24 ENCOUNTER — Encounter (INDEPENDENT_AMBULATORY_CARE_PROVIDER_SITE_OTHER): Payer: Self-pay | Admitting: Cardiovascular Disease

## 2021-07-24 VITALS — BP 134/75 | HR 58 | Ht 67.0 in | Wt 260.0 lb

## 2021-07-24 DIAGNOSIS — I10 Essential (primary) hypertension: Secondary | ICD-10-CM

## 2021-07-24 DIAGNOSIS — I2510 Atherosclerotic heart disease of native coronary artery without angina pectoris: Secondary | ICD-10-CM

## 2021-07-24 DIAGNOSIS — E7801 Familial hypercholesterolemia: Secondary | ICD-10-CM

## 2021-07-24 NOTE — Progress Notes (Signed)
[facility]- Cardiology Follow Up Note    Chief Complaint   Patient presents for follow up        Referring Physician: Renette Butters, MD     Date of follow up -July 24, 2021      HPI: 66 year old gentleman returns for follow-up cardiac evaluation.    The patient has no previous history of myocardial infarction, congestive heart failure or dysrhythmias.    The patient was first evaluated in 2021 and had an abnormal nuclear stress test.  This was followed up with a coronary CT angiogram that showed a coronary artery calcium score of 27 and minimal coronary artery disease.  Risk factor modification was recommended.  He  presented again for cardiac evaluation in 2023 with atypical chest pain.  A repeat coronary angiogram was ordered which again showed minimal coronary artery disease.  Medical therapy was recommended and we reviewed the results of the study today-    CT CORONARY ANGIOGRAPHY:      Evaluation of the coronary arteries reveals the following:     LEFT MAIN: Mixed calcified and noncalcified plaque within the distal left  main coronary artery causes less than 25% narrowing. Trifurcation into the  LAD, LCx, and ramus intermedius.     LEFT ANTERIOR DESCENDING CORONARY ARTERY: Proximal LAD is patent. Calcified  plaque within the mid LAD causes minimal narrowing less than 25%). The  distal LAD and diagonal branches are patent.     RAMUS INTERMEDIUS: Moderate to large caliber vessel that is patent without  stenosis.     LEFT CIRCUMFLEX CORONARY ARTERY: The left circumflex coronary artery and  obtuse marginal branches are patent.     RIGHT CORONARY ARTERY: The RCA is a small caliber vessel that appears  patent.     POSTERIOR DESCENDING CORONARY ARTERY:  The PDA arises from the left  coronary artery. The vessel is patent without atherosclerosis.     IMPRESSION:      1. Nonobstructive coronary artery disease similar to prior examination.  2. No significant stenosis.  3. Calcium score is 29.  4.  PFO is  similar to prior exam    He has risk factors for coronary artery disease that include hypercholesterolemia and hypertension.  There is no history of tobacco abuse, diabetes, or family history for premature coronary disease.    On extensive cardiac review of systems today he does not have typical angina.  He denies PND orthopnea and lower extremity edema.  He has not been exercising on a regular basis.  He has not been dieting on a regular basis         Review of Systems:  All other systems were reviewed and are negative unless stated above per HPI  Weight stable. No fatigue. No asthma, wheezing, or lung problem. No ulcer, gall bladder or reflux. No kidney problems. No thyroid history. Neurologic negative. No anemia or bleeding. No claudication. All other systems were reviewed and negative except as mentioned above.  Past Medical History:   Diagnosis Date    Abnormal vision     glasses    Calculus of kidney 2007    One episode    Congenital mitral insufficiency     Per [facility]  November 09, 2014, pt denies    Exercise-induced angina 06/2019    Fibromyalgia 2006    GERD (gastroesophageal reflux disease)     uses baking soda or alka seltzer prn - infrequent    History of abscessed tooth 06/07/2016  root canal and amoxicillin     Hyperlipidemia     medically managed    Hypertension     under control with medication    Low back pain     related to sitting - will require additional surgery in future    Osteoarthrosis     right hip    Snoring     no sleep study    Spinal stenosis     Tinnitus     Urinary incontinence     Per [facility]  November 09, 2014  Dr. Milagros Evener        Past Surgical History:   Procedure Laterality Date    ARTHROPLASTY, HIP TOTAL Right 06/18/2016    Procedure: ARTHROPLASTY, HIP TOTAL;  Surgeon: Estevan Oaks, MD;  Location: [facility];  Service: Orthopedics;  Laterality: Right;  RIGHT TOTAL HIP ARTHROPLASTY    BACK SURGERY  2013    First one in 2006    CARPAL TUNNEL RELEASE Bilateral 2003, 2008     left 2008, right 2003    COLONOSCOPY  09/2010    multiple    COLONOSCOPY N/A 12/27/2015    Procedure: COLONOSCOPY;  Surgeon: Halina Andreas, MD;  Location: [facility];  Service: Gastroenterology;  Laterality: N/A;  Colonoscopy  Q1-Unk     EGD N/A 11/03/2019    Procedure: EGD;  Surgeon: Kathreen Cornfield, MD;  Location: [facility];  Service: Gastroenterology;  Laterality: N/A;  EGD  Asst=N    EYE SURGERY Left March 30, 2013     Laser to repair torn retina     JOINT REPLACEMENT Left 2010    left hip    LAMINECTOMY, POSTERIOR LUMBAR, DECOMP, FUSION, LEVEL 2 N/A 05/31/2015    Procedure: LAMINECTOMY, POSTERIOR LUMBAR, DECOMP, FUSION, LEVEL 2;  Surgeon: Clelia Schaumann, MD;  Location: [facility];  Service: Orthopedics;  Laterality: N/A;  L3-L5 LAMINECTOMY, L4-L5 FUSION WITH INFUSE    REMOVAL, POSTERIOR LUMBAR SPINE HARDWARE N/A 11/18/2014    Procedure: REMOVAL, POSTERIOR LUMBAR SPINE HARDWARE;  Surgeon: Clelia Schaumann, MD;  Location: [facility];  Service: Orthopedics;  Laterality: N/A;  L4-L5 REMOVAL OF HARDWARE    SPINE SURGERY  2006,2011,2012    laminectomy 2006 & 2011, fusion 2012       Social History     Socioeconomic History    Marital status: Married   Tobacco Use    Smoking status: Former     Packs/day: 1.00     Years: 33.00     Total pack years: 33.00     Types: Cigarettes     Quit date: 12/01/1998     Years since quitting: 22.6    Smokeless tobacco: Never   Substance and Sexual Activity    Alcohol use: Yes     Alcohol/week: 1.0 standard drink of alcohol     Types: 1 Glasses of wine per week     Comment: infrequent social     Drug use: Yes     Frequency: 7.0 times per week     Types: Marijuana     Comment: Marijuana every day    Sexual activity: Yes     Partners: Female     Comment: Only with my wife. No contraception     Social Determinants of Health     Financial Resource Strain: Medium Risk (07/21/2021)    Overall Financial Resource Strain (CARDIA)     Difficulty of Paying Living Expenses:  Somewhat hard   Food Insecurity: Unknown (07/21/2021)    Hunger Vital Sign     Worried About Running Out of Food in the Last Year: Never true   Transportation Needs: No Transportation Needs (07/21/2021)    PRAPARE - Therapist, art (Medical): No     Lack of Transportation (Non-Medical): No   Physical Activity: Unknown (07/21/2021)    Exercise Vital Sign     Days of Exercise per Week: 2 days   Stress: No Stress Concern Present (07/21/2021)    [employer] of Occupational Health - Occupational Stress Questionnaire     Feeling of Stress : Not at all   Social Connections: Unknown (07/21/2021)    Social Connection and Isolation Panel [NHANES]     Frequency of Communication with Friends and Family: More than three times a week     Frequency of Social Gatherings with Friends and Family: Patient refused     Attends Religious Services: Patient refused     Active Member of Clubs or Organizations: Yes     Attends Banker Meetings: 1 to 4 times per year     Marital Status: Married   Catering manager Violence: Unknown (07/21/2021)    Humiliation, Afraid, Rape, and Kick questionnaire     Fear of Current or Ex-Partner: Patient refused     Emotionally Abused: Patient refused     Physically Abused: Patient refused     Sexually Abused: Patient refused   Housing Stability: Low Risk  (07/21/2021)    Housing Stability Vital Sign     Unable to Pay for Housing in the Last Year: No     Number of Places Lived in the Last Year: 1     Unstable Housing in the Last Year: No       Family History   Problem Relation Age of Onset    Cancer Mother         Colon Cancer     Colon cancer Mother     Arthritis Father     Hearing loss Father     Heart disease Father     Kidney disease Father     Alcohol abuse Brother     Diabetes Brother     Early death Brother     Liver cancer Brother     Arthritis Paternal Grandmother     Diabetes Maternal Grandfather     Cancer Paternal Grandfather         Colon cancer     Colon cancer  Paternal Grandfather        Allergies   Allergen Reactions    Ciprofloxacin Itching    Morphine Nausea And Vomiting         Current Outpatient Medications:     Ascorbic Acid (VITAMIN C PO), Take 1 tablet by mouth daily, Disp: , Rfl:     aspirin EC 81 MG EC tablet, Take 1 tablet (81 mg) by mouth daily, Disp: , Rfl:     b complex vitamins tablet, Take 1 tablet by mouth daily, Disp: , Rfl:     GARLIC PO, Take by mouth every evening, Disp: , Rfl:     olmesartan (BENICAR) 40 MG tablet, Take 1 tablet (40 mg) by mouth daily, Disp: , Rfl:     pantoprazole (PROTONIX) 40 MG tablet, Take 1 tablet (40 mg) by mouth 2 (two) times daily, Disp: , Rfl:     pregabalin (LYRICA) 100 MG capsule, Take 1 capsule (100  mg) by mouth 2 (two) times daily, Disp: , Rfl:     rosuvastatin (CRESTOR) 10 MG tablet, Take 1 tablet (10 mg) by mouth nightly, Disp: , Rfl:     TURMERIC PO, Take by mouth daily  , Disp: , Rfl:     VITAMIN D, ERGOCALCIFEROL, PO, Take 1 tablet by mouth daily, Disp: , Rfl:       Physical Examination:  Vitals:    07/24/21 1154   BP: 134/75   Pulse: (!) 58     General: In NAD, well developed, well nourished.  HEENT: sclera is normal, mucous membranes are moist, neck is supple. No carotid bruits  Cardiovascular: S1 S2 heard, regular rate and rhythm, without murmurs, rubs or gallops  Lungs: Clear to auscultation b/l, normal excursion, no rhonchi, rales or wheezes b/l  Abdomen: +bs, soft, nontender, non-distended, no organomegaly   Extremities: no edema, 2+ pulses b/l in upper and lower extremities  Skin: no rashes or lesions noted          Impression and Recommendations:  Jesus Bowman II is a 66 y.o. who presents for cardiac follow-up    1. Coronary artery disease involving native coronary artery of native heart without angina pectoris        2. Primary hypertension        3. Familial hypercholesterolemia            EKG June 20 showed normal sinus rhythm was a normal tracing    Assessment and plan    1.  Coronary artery  disease-patient was counseled about the importance of medical management and risk factor modification-he was counseled to start an exercise diet and weight reduction program.-Continue aspirin 81 mg p.o. daily    2.  Hypertension-presently well controlled continue medical management    3.  Hypercholesterolemia-continue statin     patient will have a fasting lipid profile during his annual physical-any further recommendations depending on the results    4.  Tiny PFO-continue to follow-continue aspirin 81 p.o. daily

## 2021-08-01 ENCOUNTER — Other Ambulatory Visit (INDEPENDENT_AMBULATORY_CARE_PROVIDER_SITE_OTHER): Payer: Self-pay | Admitting: Internal Medicine

## 2021-08-31 ENCOUNTER — Ambulatory Visit (INDEPENDENT_AMBULATORY_CARE_PROVIDER_SITE_OTHER): Payer: Medicare Other | Admitting: Adult Health

## 2021-08-31 NOTE — Progress Notes (Deleted)
[facility] [place] OFFICE VISIT    I had the pleasure of seeing Mr. Richner today for cardiovascular follow up. He is a pleasant 66 y.o. male with a history of HTN, HLD, mild nonobs CAD and PFO who presents for hospital follow-up.     Patient admitted to [facility] 7/20-7/21 after a presyncopal episode. Episode occurred at noon, sitting on deck. Room was spinning and he was nauseous. He had taken a vicodin the night prior. EKG was unremarkable. VSS. CTA head and neck with no acute abnormality. Echocardiogram with normal LVEF, no thrombus, no PFO or ASD noted. Per neurology note this was not consistent with CVA or TIA.          MEDICATIONS:   Current Outpatient Medications:     Ascorbic Acid (VITAMIN C PO), Take 1 tablet by mouth daily, Disp: , Rfl:     aspirin EC 81 MG EC tablet, Take 1 tablet (81 mg) by mouth daily, Disp: , Rfl:     b complex vitamins tablet, Take 1 tablet by mouth daily, Disp: , Rfl:     GARLIC PO, Take by mouth every evening, Disp: , Rfl:     olmesartan (BENICAR) 40 MG tablet, Take 1 tablet (40 mg) by mouth daily, Disp: , Rfl:     pantoprazole (PROTONIX) 40 MG tablet, Take 1 tablet (40 mg) by mouth 2 (two) times daily, Disp: , Rfl:     pregabalin (LYRICA) 100 MG capsule, Take 1 capsule (100 mg) by mouth 2 (two) times daily, Disp: , Rfl:     rosuvastatin (CRESTOR) 10 MG tablet, Take 1 tablet (10 mg) by mouth nightly, Disp: , Rfl:     TURMERIC PO, Take by mouth daily  , Disp: , Rfl:     VITAMIN D, ERGOCALCIFEROL, PO, Take 1 tablet by mouth daily, Disp: , Rfl:      PHYSICAL EXAMINATION  Health Related Quality of Life:     Vital Signs: There were no vitals taken for this visit.   Chest: Clear to auscultation bilaterally  Cardiovascular: No murmurs or gallops.   Abdomen: Soft, nontender. No pulsatile masses or bruits.    Extremities: Warm without edema. Peripheral pulses are full and equal.    ECG: {ekg findings:315101::"normal sinus rhythm at *** bpm","normal axis and intervals","no significant ST/T  changes","normal EKG","no significant change from prior"}.     LABS:   Lab Results   Component Value Date    WBC 5.82 06/27/2021    HGB 14.2 06/27/2021    HCT 43.8 06/27/2021    PLT 255 06/27/2021    NA 136 06/27/2021    K 4.6 06/27/2021    BUN 13.0 06/27/2021    CREAT 1.2 06/27/2021    GLU 85 06/27/2021    AST 23 06/27/2021    ALT 18 06/27/2021        IMPRESSION/RECOMMENDATIONS: Mr. Carnero is a 66 y.o. male who presents for hospital follow-up.     - Presycope, likely mutilfactorial in setitng of narcotic use and sleep deporvations. Neruro     ***

## 2022-03-05 ENCOUNTER — Other Ambulatory Visit: Payer: Self-pay | Admitting: Internal Medicine

## 2023-03-11 ENCOUNTER — Other Ambulatory Visit (INDEPENDENT_AMBULATORY_CARE_PROVIDER_SITE_OTHER): Payer: Self-pay | Admitting: Internal Medicine

## 2023-11-19 ENCOUNTER — Other Ambulatory Visit: Payer: Self-pay

## 2023-11-19 DIAGNOSIS — M75111 Incomplete rotator cuff tear or rupture of right shoulder, not specified as traumatic: Secondary | ICD-10-CM

## 2023-11-20 ENCOUNTER — Other Ambulatory Visit: Payer: Self-pay

## 2023-11-20 ENCOUNTER — Ambulatory Visit
Admission: RE | Admit: 2023-11-20 | Discharge: 2023-11-20 | Disposition: A | Discharge status: Home / Self Care | Source: Ambulatory Visit

## 2023-11-20 DIAGNOSIS — M75111 Incomplete rotator cuff tear or rupture of right shoulder, not specified as traumatic: Secondary | ICD-10-CM | POA: Insufficient documentation
